# Patient Record
Sex: Male | Born: 1935 | Race: White | Hispanic: No | Smoking: Former smoker
Health system: Southern US, Community
[De-identification: ages and names within clinical notes are randomized; demographics above are authoritative.]

## PROBLEM LIST (undated history)

## (undated) DIAGNOSIS — I10 Essential (primary) hypertension: Secondary | ICD-10-CM

## (undated) DIAGNOSIS — I519 Heart disease, unspecified: Secondary | ICD-10-CM

## (undated) DIAGNOSIS — R413 Other amnesia: Secondary | ICD-10-CM

## (undated) DIAGNOSIS — T7840XA Allergy, unspecified, initial encounter: Secondary | ICD-10-CM

## (undated) DIAGNOSIS — H9190 Unspecified hearing loss, unspecified ear: Secondary | ICD-10-CM

## (undated) DIAGNOSIS — E119 Type 2 diabetes mellitus without complications: Secondary | ICD-10-CM

## (undated) HISTORY — DX: Unspecified hearing loss, unspecified ear: H91.90

## (undated) HISTORY — DX: Allergy, unspecified, initial encounter: T78.40XA

## (undated) HISTORY — DX: Essential (primary) hypertension: I10

## (undated) HISTORY — DX: Type 2 diabetes mellitus without complications: E11.9

## (undated) HISTORY — PX: PACEMAKER PLACEMENT: SHX43

## (undated) HISTORY — DX: Other amnesia: R41.3

## (undated) HISTORY — DX: Heart disease, unspecified: I51.9

---

## 2005-08-09 ENCOUNTER — Ambulatory Visit: Payer: Self-pay | Admitting: Family Medicine

## 2005-08-15 ENCOUNTER — Ambulatory Visit: Payer: Self-pay | Admitting: Family Medicine

## 2005-09-14 ENCOUNTER — Ambulatory Visit: Payer: Self-pay | Admitting: Family Medicine

## 2005-10-15 ENCOUNTER — Ambulatory Visit: Payer: Self-pay | Admitting: Family Medicine

## 2009-09-07 ENCOUNTER — Encounter: Payer: Self-pay | Admitting: Internal Medicine

## 2009-09-14 ENCOUNTER — Encounter: Payer: Self-pay | Admitting: Internal Medicine

## 2009-10-05 ENCOUNTER — Encounter: Payer: Self-pay | Admitting: Internal Medicine

## 2009-10-15 ENCOUNTER — Encounter: Payer: Self-pay | Admitting: Internal Medicine

## 2009-11-15 ENCOUNTER — Encounter: Payer: Self-pay | Admitting: Internal Medicine

## 2009-12-13 ENCOUNTER — Encounter: Payer: Self-pay | Admitting: Internal Medicine

## 2010-06-07 ENCOUNTER — Ambulatory Visit: Payer: Self-pay | Admitting: Family Medicine

## 2011-07-03 ENCOUNTER — Observation Stay: Payer: Self-pay | Admitting: *Deleted

## 2012-05-09 ENCOUNTER — Ambulatory Visit: Payer: Self-pay | Admitting: Emergency Medicine

## 2013-02-08 ENCOUNTER — Observation Stay: Payer: Self-pay | Admitting: Internal Medicine

## 2013-02-08 LAB — URINALYSIS, COMPLETE
Bacteria: NONE SEEN
Bilirubin,UR: NEGATIVE
Ketone: NEGATIVE
Ph: 5 (ref 4.5–8.0)
Protein: NEGATIVE
RBC,UR: 2 /HPF (ref 0–5)
Specific Gravity: 1.017 (ref 1.003–1.030)
Squamous Epithelial: NONE SEEN

## 2013-02-08 LAB — COMPREHENSIVE METABOLIC PANEL
Albumin: 3.5 g/dL (ref 3.4–5.0)
Bilirubin,Total: 1.7 mg/dL — ABNORMAL HIGH (ref 0.2–1.0)
Calcium, Total: 8.8 mg/dL (ref 8.5–10.1)
Creatinine: 0.85 mg/dL (ref 0.60–1.30)
EGFR (African American): >60
EGFR (Non-African Amer.): >60
Glucose: 115 mg/dL — ABNORMAL HIGH (ref 65–99)
Osmolality: 280 (ref 275–301)
Potassium: 3.7 mmol/L (ref 3.5–5.1)

## 2013-02-08 LAB — CK TOTAL AND CKMB (NOT AT ARMC)
CK, Total: 186 U/L (ref 35–232)
CK-MB: 2.2 ng/mL (ref 0.5–3.6)

## 2013-02-08 LAB — CBC
HCT: 43.7 % (ref 40.0–52.0)
HGB: 15.1 g/dL (ref 13.0–18.0)
MCH: 30.8 pg (ref 26.0–34.0)
RBC: 4.92 10*6/uL (ref 4.40–5.90)

## 2013-02-08 LAB — PROTIME-INR
INR: 1.1
Prothrombin Time: 14.4 secs (ref 11.5–14.7)

## 2013-02-09 LAB — BASIC METABOLIC PANEL
Calcium, Total: 8.3 mg/dL — ABNORMAL LOW (ref 8.5–10.1)
Chloride: 107 mmol/L (ref 98–107)
Co2: 26 mmol/L (ref 21–32)
Creatinine: 0.87 mg/dL (ref 0.60–1.30)
EGFR (African American): >60
EGFR (Non-African Amer.): >60
Osmolality: 281 (ref 275–301)
Sodium: 141 mmol/L (ref 136–145)

## 2013-02-09 LAB — CBC WITH DIFFERENTIAL/PLATELET
Basophil %: 0.8 %
Eosinophil %: 5.3 %
HCT: 41.6 % (ref 40.0–52.0)
HGB: 14.8 g/dL (ref 13.0–18.0)
Lymphocyte #: 1.1 10*3/uL (ref 1.0–3.6)
MCHC: 35.4 g/dL (ref 32.0–36.0)
Monocyte #: 0.7 x10 3/mm (ref 0.2–1.0)
Monocyte %: 9.4 %
Neutrophil %: 68.6 %
Platelet: 243 10*3/uL (ref 150–440)
RBC: 4.74 10*6/uL (ref 4.40–5.90)
WBC: 7.1 10*3/uL (ref 3.8–10.6)

## 2013-12-31 ENCOUNTER — Ambulatory Visit (INDEPENDENT_AMBULATORY_CARE_PROVIDER_SITE_OTHER): Payer: 59 | Admitting: Podiatrist

## 2013-12-31 ENCOUNTER — Encounter: Payer: Self-pay | Admitting: Podiatrist

## 2013-12-31 VITALS — BP 126/58 | HR 78 | Resp 16 | Ht 65.0 in | Wt 182.0 lb

## 2013-12-31 DIAGNOSIS — B351 Tinea unguium: Secondary | ICD-10-CM

## 2013-12-31 DIAGNOSIS — M79609 Pain in unspecified limb: Secondary | ICD-10-CM

## 2013-12-31 NOTE — Progress Notes (Signed)
   HPI:  Patient presents today for follow up of foot and nail care. Denies any new complaints today. He has a raised sore on the medial side of the right ankle from scratching at his psoriasis.   Objective:  Patients chart is reviewed.  Vascular status reveals pedal pulses noted at 1 out of 4 dp and pt bilateral .  Neurological sensation is Normal to [facility] bilateral.  Patients nails are thickened, discolored, distrophic, friable and brittle with yellow-brown discoloration. Patient subjectively relates they are painful with shoes and with ambulation of bilateral feet. No pre ulcerative lesions present today  Assessment:  Symptomatic onychomycosis  Plan:  Discussed treatment options and alternatives.  The symptomatic toenails were debrided through manual an mechanical means without complication.  Return appointment recommended at routine intervals of 3 months    Marlowe AschoffKathryn Egerton, DPM

## 2014-02-12 ENCOUNTER — Ambulatory Visit: Payer: Self-pay | Admitting: Internal Medicine

## 2014-02-26 LAB — COMPREHENSIVE METABOLIC PANEL
ALK PHOS: 70 U/L
Albumin: 3.4 g/dL (ref 3.4–5.0)
Anion Gap: 6 — ABNORMAL LOW (ref 7–16)
BUN: 15 mg/dL (ref 7–18)
Bilirubin,Total: 1.4 mg/dL — ABNORMAL HIGH (ref 0.2–1.0)
CHLORIDE: 103 mmol/L (ref 98–107)
CO2: 28 mmol/L (ref 21–32)
CREATININE: 0.75 mg/dL (ref 0.60–1.30)
Calcium, Total: 8.8 mg/dL (ref 8.5–10.1)
EGFR (African American): >60
EGFR (Non-African Amer.): >60
Glucose: 142 mg/dL — ABNORMAL HIGH (ref 65–99)
Osmolality: 277 (ref 275–301)
POTASSIUM: 3.7 mmol/L (ref 3.5–5.1)
SGOT(AST): 13 U/L — ABNORMAL LOW (ref 15–37)
SGPT (ALT): 17 U/L (ref 12–78)
Sodium: 137 mmol/L (ref 136–145)
TOTAL PROTEIN: 7.5 g/dL (ref 6.4–8.2)

## 2014-02-26 LAB — CBC
HCT: 44.4 % (ref 40.0–52.0)
HGB: 15 g/dL (ref 13.0–18.0)
MCH: 30 pg (ref 26.0–34.0)
MCHC: 33.7 g/dL (ref 32.0–36.0)
MCV: 89 fL (ref 80–100)
Platelet: 285 10*3/uL (ref 150–440)
RBC: 4.98 10*6/uL (ref 4.40–5.90)
RDW: 13.8 % (ref 11.5–14.5)
WBC: 11.7 10*3/uL — AB (ref 3.8–10.6)

## 2014-02-26 LAB — URINALYSIS, COMPLETE
Bacteria: NONE SEEN
Bilirubin,UR: NEGATIVE
Glucose,UR: >=500 mg/dL
Ketone: NEGATIVE
Leukocyte Esterase: NEGATIVE
Nitrite: NEGATIVE
Ph: 7
Protein: NEGATIVE
RBC,UR: 2 /HPF
Specific Gravity: 1.014
Squamous Epithelial: NONE SEEN
WBC UR: 2 /HPF

## 2014-02-26 LAB — CK TOTAL AND CKMB (NOT AT ARMC)
CK, Total: 100 U/L
CK-MB: 2.5 ng/mL

## 2014-02-26 LAB — TROPONIN I

## 2014-02-27 ENCOUNTER — Ambulatory Visit: Payer: Self-pay | Admitting: Neurology

## 2014-02-27 LAB — CBC WITH DIFFERENTIAL/PLATELET
BASOS ABS: 0.1 10*3/uL (ref 0.0–0.1)
Basophil %: 0.4 %
Eosinophil #: 0.1 10*3/uL (ref 0.0–0.7)
Eosinophil %: 0.6 %
HCT: 42.2 % (ref 40.0–52.0)
HGB: 14.4 g/dL (ref 13.0–18.0)
LYMPHS ABS: 1 10*3/uL (ref 1.0–3.6)
Lymphocyte %: 8.1 %
MCH: 29.9 pg (ref 26.0–34.0)
MCHC: 34.1 g/dL (ref 32.0–36.0)
MCV: 88 fL (ref 80–100)
Monocyte #: 1 x10 3/mm (ref 0.2–1.0)
Monocyte %: 7.8 %
Neutrophil #: 10.6 10*3/uL — ABNORMAL HIGH (ref 1.4–6.5)
Neutrophil %: 83.1 %
Platelet: 295 10*3/uL (ref 150–440)
RBC: 4.8 10*6/uL (ref 4.40–5.90)
RDW: 13.5 % (ref 11.5–14.5)
WBC: 12.8 10*3/uL — ABNORMAL HIGH (ref 3.8–10.6)

## 2014-02-27 LAB — BASIC METABOLIC PANEL
ANION GAP: 4 — AB (ref 7–16)
BUN: 12 mg/dL (ref 7–18)
CHLORIDE: 107 mmol/L (ref 98–107)
CREATININE: 0.94 mg/dL (ref 0.60–1.30)
Calcium, Total: 8.4 mg/dL — ABNORMAL LOW (ref 8.5–10.1)
Co2: 26 mmol/L (ref 21–32)
EGFR (African American): >60
EGFR (Non-African Amer.): >60
Glucose: 125 mg/dL — ABNORMAL HIGH (ref 65–99)
Osmolality: 275 (ref 275–301)
Potassium: 3.7 mmol/L (ref 3.5–5.1)
Sodium: 137 mmol/L (ref 136–145)

## 2014-02-28 LAB — URINE CULTURE

## 2014-03-01 ENCOUNTER — Encounter: Payer: Self-pay | Admitting: Internal Medicine

## 2014-03-02 ENCOUNTER — Inpatient Hospital Stay: Payer: Self-pay | Admitting: Internal Medicine

## 2014-03-02 LAB — URINALYSIS, COMPLETE
BILIRUBIN, UR: NEGATIVE
Bacteria: NONE SEEN
Blood: NEGATIVE
Glucose,UR: 150 mg/dL (ref 0–75)
Ketone: NEGATIVE
NITRITE: NEGATIVE
Ph: 6 (ref 4.5–8.0)
Protein: NEGATIVE
RBC,UR: 2 /HPF (ref 0–5)
SPECIFIC GRAVITY: 1.016 (ref 1.003–1.030)
SQUAMOUS EPITHELIAL: NONE SEEN

## 2014-03-03 LAB — BASIC METABOLIC PANEL
Anion Gap: 7 (ref 7–16)
BUN: 20 mg/dL — ABNORMAL HIGH (ref 7–18)
CALCIUM: 9 mg/dL (ref 8.5–10.1)
CHLORIDE: 106 mmol/L (ref 98–107)
CO2: 25 mmol/L (ref 21–32)
Creatinine: 0.81 mg/dL (ref 0.60–1.30)
EGFR (Non-African Amer.): >60
Glucose: 110 mg/dL — ABNORMAL HIGH (ref 65–99)
Osmolality: 279 (ref 275–301)
POTASSIUM: 4.1 mmol/L (ref 3.5–5.1)
SODIUM: 138 mmol/L (ref 136–145)

## 2014-03-03 LAB — SEDIMENTATION RATE: ERYTHROCYTE SED RATE: 4 mm/h (ref 0–20)

## 2014-03-03 LAB — CBC WITH DIFFERENTIAL/PLATELET
BASOS PCT: 0.9 %
Basophil #: 0.1 10*3/uL (ref 0.0–0.1)
Eosinophil #: 0.4 10*3/uL (ref 0.0–0.7)
Eosinophil %: 4.1 %
HCT: 44.5 % (ref 40.0–52.0)
HGB: 15.5 g/dL (ref 13.0–18.0)
LYMPHS ABS: 1.3 10*3/uL (ref 1.0–3.6)
LYMPHS PCT: 14.4 %
MCH: 30.8 pg (ref 26.0–34.0)
MCHC: 34.7 g/dL (ref 32.0–36.0)
MCV: 89 fL (ref 80–100)
MONOS PCT: 10.2 %
Monocyte #: 0.9 x10 3/mm (ref 0.2–1.0)
NEUTROS ABS: 6.5 10*3/uL (ref 1.4–6.5)
Neutrophil %: 70.4 %
Platelet: 310 10*3/uL (ref 150–440)
RBC: 5.01 10*6/uL (ref 4.40–5.90)
RDW: 13.9 % (ref 11.5–14.5)
WBC: 9.3 10*3/uL (ref 3.8–10.6)

## 2014-03-03 LAB — HEPATIC FUNCTION PANEL A (ARMC)
ALK PHOS: 66 U/L
Albumin: 3.2 g/dL — ABNORMAL LOW (ref 3.4–5.0)
Bilirubin, Direct: 0.2 mg/dL (ref 0.00–0.20)
Bilirubin,Total: 0.8 mg/dL (ref 0.2–1.0)
SGOT(AST): 27 U/L (ref 15–37)
SGPT (ALT): 24 U/L (ref 12–78)
TOTAL PROTEIN: 6.8 g/dL (ref 6.4–8.2)

## 2014-03-03 LAB — AMMONIA: AMMONIA, PLASMA: 17 umol/L (ref 11–32)

## 2014-03-05 LAB — PLATELET COUNT: Platelet: 335 10*3/uL (ref 150–440)

## 2014-03-15 ENCOUNTER — Encounter: Payer: Self-pay | Admitting: Internal Medicine

## 2014-03-15 ENCOUNTER — Ambulatory Visit: Payer: Self-pay | Admitting: Internal Medicine

## 2014-03-19 LAB — COMPREHENSIVE METABOLIC PANEL
ALT: 43 U/L (ref 12–78)
AST: 24 U/L (ref 15–37)
Albumin: 2.9 g/dL — ABNORMAL LOW (ref 3.4–5.0)
Alkaline Phosphatase: 69 U/L
Anion Gap: 7 (ref 7–16)
BILIRUBIN TOTAL: 0.8 mg/dL (ref 0.2–1.0)
BUN: 69 mg/dL — AB (ref 7–18)
CALCIUM: 8.7 mg/dL (ref 8.5–10.1)
CHLORIDE: 113 mmol/L — AB (ref 98–107)
Co2: 17 mmol/L — ABNORMAL LOW (ref 21–32)
Creatinine: 1.56 mg/dL — ABNORMAL HIGH (ref 0.60–1.30)
EGFR (African American): 47 — ABNORMAL LOW
EGFR (Non-African Amer.): 40 — ABNORMAL LOW
Glucose: 224 mg/dL — ABNORMAL HIGH (ref 65–99)
Osmolality: 301 (ref 275–301)
Potassium: 4.2 mmol/L (ref 3.5–5.1)
Sodium: 137 mmol/L (ref 136–145)
Total Protein: 7.9 g/dL (ref 6.4–8.2)

## 2014-03-19 LAB — LIPASE, BLOOD: Lipase: 76 U/L (ref 73–393)

## 2014-03-19 LAB — TROPONIN I: Troponin-I: <0.02 ng/mL

## 2014-03-19 LAB — CBC
HCT: 47.6 % (ref 40.0–52.0)
HGB: 15.2 g/dL (ref 13.0–18.0)
MCH: 28.8 pg (ref 26.0–34.0)
MCHC: 31.9 g/dL — ABNORMAL LOW (ref 32.0–36.0)
MCV: 90 fL (ref 80–100)
Platelet: 406 10*3/uL (ref 150–440)
RBC: 5.27 10*6/uL (ref 4.40–5.90)
RDW: 13.6 % (ref 11.5–14.5)
WBC: 31.4 10*3/uL — AB (ref 3.8–10.6)

## 2014-03-19 LAB — MAGNESIUM: Magnesium: 2.4 mg/dL

## 2014-03-20 ENCOUNTER — Inpatient Hospital Stay: Payer: Self-pay | Admitting: Internal Medicine

## 2014-03-20 LAB — URINALYSIS, COMPLETE
BILIRUBIN, UR: NEGATIVE
Bacteria: NONE SEEN
Blood: NEGATIVE
Glucose,UR: 50 mg/dL (ref 0–75)
Hyaline Cast: 8
Ketone: NEGATIVE
NITRITE: NEGATIVE
Ph: 5 (ref 4.5–8.0)
Protein: NEGATIVE
RBC,UR: 7 /HPF (ref 0–5)
SQUAMOUS EPITHELIAL: NONE SEEN
Specific Gravity: 1.024 (ref 1.003–1.030)

## 2014-03-20 LAB — PROTIME-INR
INR: 1.3
Prothrombin Time: 16 secs — ABNORMAL HIGH (ref 11.5–14.7)

## 2014-03-20 LAB — PHOSPHORUS: Phosphorus: 3.5 mg/dL (ref 2.5–4.9)

## 2014-03-20 LAB — DIFFERENTIAL
Basophil #: 0.1 10*3/uL (ref 0.0–0.1)
Basophil %: 0.3 %
EOS ABS: 0 10*3/uL (ref 0.0–0.7)
Eosinophil %: 0.1 %
LYMPHS ABS: 0.7 10*3/uL — AB (ref 1.0–3.6)
Lymphocyte %: 2.2 %
Monocyte #: 1.3 x10 3/mm — ABNORMAL HIGH (ref 0.2–1.0)
Monocyte %: 4.2 %
NEUTROS ABS: 29.2 10*3/uL — AB (ref 1.4–6.5)
Neutrophil %: 93.2 %

## 2014-03-21 LAB — URINE CULTURE

## 2014-03-21 LAB — BASIC METABOLIC PANEL
ANION GAP: 4 — AB (ref 7–16)
BUN: 34 mg/dL — ABNORMAL HIGH (ref 7–18)
Calcium, Total: 7.8 mg/dL — ABNORMAL LOW (ref 8.5–10.1)
Chloride: 121 mmol/L — ABNORMAL HIGH (ref 98–107)
Co2: 20 mmol/L — ABNORMAL LOW (ref 21–32)
Creatinine: 1.05 mg/dL (ref 0.60–1.30)
EGFR (African American): >60
EGFR (Non-African Amer.): >60
Glucose: 119 mg/dL — ABNORMAL HIGH (ref 65–99)
Osmolality: 297 (ref 275–301)
Potassium: 3.5 mmol/L (ref 3.5–5.1)
SODIUM: 145 mmol/L (ref 136–145)

## 2014-03-22 LAB — CBC WITH DIFFERENTIAL/PLATELET
Basophil #: 0.1 10*3/uL (ref 0.0–0.1)
Basophil %: 0.5 %
Eosinophil #: 0.5 10*3/uL (ref 0.0–0.7)
Eosinophil %: 3.6 %
HCT: 36.6 % — ABNORMAL LOW (ref 40.0–52.0)
HGB: 12.1 g/dL — AB (ref 13.0–18.0)
LYMPHS PCT: 8.1 %
Lymphocyte #: 1.1 10*3/uL (ref 1.0–3.6)
MCH: 29.6 pg (ref 26.0–34.0)
MCHC: 33.1 g/dL (ref 32.0–36.0)
MCV: 89 fL (ref 80–100)
MONO ABS: 1.3 x10 3/mm — AB (ref 0.2–1.0)
Monocyte %: 9.4 %
Neutrophil #: 10.6 10*3/uL — ABNORMAL HIGH (ref 1.4–6.5)
Neutrophil %: 78.4 %
PLATELETS: 298 10*3/uL (ref 150–440)
RBC: 4.09 10*6/uL — AB (ref 4.40–5.90)
RDW: 13.4 % (ref 11.5–14.5)
WBC: 13.5 10*3/uL — ABNORMAL HIGH (ref 3.8–10.6)

## 2014-03-23 ENCOUNTER — Encounter: Payer: Self-pay | Admitting: Internal Medicine

## 2014-03-23 LAB — CBC WITH DIFFERENTIAL/PLATELET
Basophil #: 0.1 10*3/uL (ref 0.0–0.1)
Basophil %: 0.5 %
EOS PCT: 4.2 %
Eosinophil #: 0.6 10*3/uL (ref 0.0–0.7)
HCT: 37.6 % — AB (ref 40.0–52.0)
HGB: 12.8 g/dL — ABNORMAL LOW (ref 13.0–18.0)
LYMPHS ABS: 1.1 10*3/uL (ref 1.0–3.6)
LYMPHS PCT: 8.2 %
MCH: 29.9 pg (ref 26.0–34.0)
MCHC: 34.1 g/dL (ref 32.0–36.0)
MCV: 88 fL (ref 80–100)
Monocyte #: 1.1 x10 3/mm — ABNORMAL HIGH (ref 0.2–1.0)
Monocyte %: 8.1 %
NEUTROS ABS: 10.8 10*3/uL — AB (ref 1.4–6.5)
NEUTROS PCT: 79 %
PLATELETS: 296 10*3/uL (ref 150–440)
RBC: 4.29 10*6/uL — ABNORMAL LOW (ref 4.40–5.90)
RDW: 12.9 % (ref 11.5–14.5)
WBC: 13.7 10*3/uL — ABNORMAL HIGH (ref 3.8–10.6)

## 2014-03-23 LAB — BASIC METABOLIC PANEL
Anion Gap: 8 (ref 7–16)
BUN: 13 mg/dL (ref 7–18)
CO2: 19 mmol/L — AB (ref 21–32)
Calcium, Total: 8 mg/dL — ABNORMAL LOW (ref 8.5–10.1)
Chloride: 116 mmol/L — ABNORMAL HIGH (ref 98–107)
Creatinine: 0.81 mg/dL (ref 0.60–1.30)
EGFR (African American): >60
EGFR (Non-African Amer.): >60
Glucose: 97 mg/dL (ref 65–99)
Osmolality: 285 (ref 275–301)
Potassium: 3.4 mmol/L — ABNORMAL LOW (ref 3.5–5.1)
Sodium: 143 mmol/L (ref 136–145)

## 2014-03-23 LAB — MAGNESIUM: Magnesium: 2 mg/dL

## 2014-03-23 LAB — CLOSTRIDIUM DIFFICILE(ARMC)

## 2014-03-24 LAB — WBCS, STOOL

## 2014-03-25 LAB — CULTURE, BLOOD (SINGLE)

## 2014-03-25 LAB — STOOL CULTURE

## 2014-03-30 LAB — BASIC METABOLIC PANEL
ANION GAP: 9 (ref 7–16)
BUN: 14 mg/dL (ref 7–18)
CHLORIDE: 104 mmol/L (ref 98–107)
CO2: 23 mmol/L (ref 21–32)
CREATININE: 0.71 mg/dL (ref 0.60–1.30)
Calcium, Total: 8.7 mg/dL (ref 8.5–10.1)
EGFR (African American): >60
Glucose: 119 mg/dL — ABNORMAL HIGH (ref 65–99)
Osmolality: 274 (ref 275–301)
POTASSIUM: 3.9 mmol/L (ref 3.5–5.1)
Sodium: 136 mmol/L (ref 136–145)

## 2014-03-30 LAB — CBC WITH DIFFERENTIAL/PLATELET
BASOS ABS: 0.1 10*3/uL (ref 0.0–0.1)
BASOS PCT: 0.7 %
EOS ABS: 0.5 10*3/uL (ref 0.0–0.7)
EOS PCT: 4.4 %
HCT: 41 % (ref 40.0–52.0)
HGB: 13.9 g/dL (ref 13.0–18.0)
LYMPHS PCT: 12.7 %
Lymphocyte #: 1.5 10*3/uL (ref 1.0–3.6)
MCH: 29.8 pg (ref 26.0–34.0)
MCHC: 33.9 g/dL (ref 32.0–36.0)
MCV: 88 fL (ref 80–100)
MONOS PCT: 10.4 %
Monocyte #: 1.2 x10 3/mm — ABNORMAL HIGH (ref 0.2–1.0)
NEUTROS ABS: 8.6 10*3/uL — AB (ref 1.4–6.5)
Neutrophil %: 71.8 %
Platelet: 348 10*3/uL (ref 150–440)
RBC: 4.66 10*6/uL (ref 4.40–5.90)
RDW: 13.5 % (ref 11.5–14.5)
WBC: 12 10*3/uL — AB (ref 3.8–10.6)

## 2014-04-01 ENCOUNTER — Ambulatory Visit: Payer: 59 | Admitting: Podiatrist

## 2014-04-14 ENCOUNTER — Encounter: Payer: Self-pay | Admitting: Internal Medicine

## 2014-05-15 ENCOUNTER — Encounter: Payer: Self-pay | Admitting: Internal Medicine

## 2014-06-15 ENCOUNTER — Encounter: Payer: Self-pay | Admitting: Internal Medicine

## 2014-07-15 ENCOUNTER — Encounter: Payer: Self-pay | Admitting: Internal Medicine

## 2014-08-15 ENCOUNTER — Encounter: Payer: Self-pay | Admitting: Internal Medicine

## 2014-09-14 ENCOUNTER — Encounter: Payer: Self-pay | Admitting: Internal Medicine

## 2014-09-28 LAB — BASIC METABOLIC PANEL
ANION GAP: 7 (ref 7–16)
BUN: 12 mg/dL (ref 7–18)
CREATININE: 0.86 mg/dL (ref 0.60–1.30)
Calcium, Total: 8.6 mg/dL (ref 8.5–10.1)
Chloride: 105 mmol/L (ref 98–107)
Co2: 26 mmol/L (ref 21–32)
EGFR (African American): >60
EGFR (Non-African Amer.): >60
Glucose: 93 mg/dL (ref 65–99)
OSMOLALITY: 275 (ref 275–301)
Potassium: 4 mmol/L (ref 3.5–5.1)
Sodium: 138 mmol/L (ref 136–145)

## 2014-10-15 ENCOUNTER — Encounter: Payer: Self-pay | Admitting: Internal Medicine

## 2014-11-15 ENCOUNTER — Encounter: Payer: Self-pay | Admitting: Internal Medicine

## 2014-12-14 ENCOUNTER — Encounter
Admit: 2014-12-14 | Disposition: A | Discharge status: Home / Self Care | Payer: Self-pay | Attending: Internal Medicine | Admitting: Internal Medicine

## 2015-01-14 ENCOUNTER — Encounter
Admit: 2015-01-14 | Disposition: A | Discharge status: Home / Self Care | Payer: Self-pay | Attending: Internal Medicine | Admitting: Internal Medicine

## 2015-01-14 DEATH — deceased

## 2015-02-04 NOTE — H&P (Signed)
PATIENT NAME:  Sean Kaufman, SULTON MR#:  [id number] DATE OF BIRTH:  22-Sep-1927  DATE OF ADMISSION:  02/08/2013  PRIMARY CARE PHYSICIAN: Dr. Rolin Barry.   CHIEF COMPLAINT: Altered mental status.   HISTORY OF PRESENT ILLNESS: This is an 81 year old male who presents to the hospital due to confusion and altered mental status that started late yesterday evening. The patient himself is currently demented and is delirious, therefore most of the history was obtained from the wife and the daughter at bedside.   As per the family, the patient has underlying dementia but is able to do most of his ADLs at home. Since yesterday evening the patient has been more lethargic and confused than usual. As per the wife, the patient fell this morning. He also has been picking at things and hallucinating over the past day or so. As per the family, the patient about a week or so ago was switched from trazodone to Tylenol PM for sleep. The Tylenol PM does not seem to have helped him as he has not slept well in the past few days. His last good night of sleep was about 2 days ago. Despite having a good night of sleep 2 days ago the patient's delirium has not improved since he fell this morning, and he was still weak and confused. He was brought to the ER for further evaluation.   REVIEW OF SYSTEMS:  CONSTITUTIONAL: No documented fever. No weight gain, no weight loss.  EYES: No blurred or double vision.  ENT: No tinnitus. No postnasal drip. No redness of the oropharynx.  RESPIRATORY: No cough, no wheeze, no hemoptysis, no dyspnea.  CARDIOVASCULAR: No chest pain, no orthopnea, no palpitations, no syncope.  GASTROINTESTINAL: No nausea, no vomiting, no diarrhea. No abdominal pain, no melena or hematochezia.  GENITOURINARY: No dysuria or hematuria.  ENDOCRINE: No polyuria or nocturia, heat or cold intolerance.  HEMATOLOGIC: No anemia. No bruising. No bleeding.  INTEGUMENTARY: No rashes. No lesions.  MUSCULOSKELETAL: No  arthritis. No swelling. No gout.  NEUROLOGIC: No numbness. No tingling. No ataxia. No seizure-type activity.  PSYCHIATRIC: No anxiety. No insomnia. No ADD. Positive dementia.  ALLERGIES: PREDNISONE.    SOCIAL HISTORY: No smoking. No alcohol abuse. No illicit drug abuse. Lives at home with his wife.   FAMILY HISTORY: The patient's mother died from complications of pneumonia. Father died due to alcohol abuse.   CURRENT MEDICATIONS: Are as follows: Amlodipine 5 mg daily, aspirin 81 mg daily, Coreg 3.125 mg b.i.d., Colace 100 mg 3 times weekly as needed, Aricept 5 mg at bedtime, lisinopril 40 mg daily, metformin 850 mg daily, trazodone 50 mg, one-half tablet  to 1 tablet at bedtime, vitamin D3 1000 international units daily, vitamin E 400 international units daily.   PHYSICAL EXAMINATION:  On admission was as follows:   Patient's vital signs were noted to be: Temperature is 98.5, pulse 62, respirations 18, blood pressure 125/58, sats 97% on room air.   GENERAL: The patient is a pleasant-appearing male, but confused and hallucinating.  HEENT: He is atraumatic, normocephalic. He would not open his eyes. Difficult to examine. He would also not open his mouth, therefore I did not examine his oropharynx.  NECK: Supple. There is no jugular venous distention. No bruits, no lymphadenopathy or thyromegaly.  HEART EXAM: Regular rate and rhythm. No murmurs. No rubs. No clicks.  LUNGS: Clear to auscultation bilaterally. No rales, no rhonchi. No wheezes. No dullness to percussion.  ABDOMEN: Soft, flat, nontender, nondistended. Has good bowel sounds.  No hepatosplenomegaly appreciated.  EXTREMITIES: No evidence of any cyanosis, clubbing, or peripheral edema. Has +2 pedal and radial pulses bilaterally.  NEUROLOGICAL: The patient is alert, awake, and oriented x 1. Moves all extremities spontaneously. Difficult to do a full neurological exam given his acute delirium and underlying dementia.  SKIN: Moist, warm,  with no rashes.  LYMPHATIC: There is no cervical or axillary lymphadenopathy.   LABORATORY EXAM: Showed a serum glucose of 115, BUN 16, creatinine 0.8, sodium 139, potassium 3.7, chloride 107, bicarbonate 24.   Patient's LFTs are within normal limits. Troponin less than 0.02.   White cell count 10.4, hemoglobin 15.1, hematocrit 43.7, platelet count of 267. INR is 1.1. Urinalysis is within normal limits.   The patient did have a CT of the head done without contrast which showed no evidence of any acute intracranial process.   The patient also had a CT of the cervical spine without contrast which showed no evidence of any acute osseous injury.   The patient also had a chest x-ray done which showed no acute cardiopulmonary disease of the chest.   X-ray of the left shoulder showing no acute osseous abnormality.   An x-ray of the left elbow also showing no evidence of any acute osseous abnormality.   An x-ray of the right knee showing no acute osseous injury either.   ASSESSMENT AND PLAN: This is an 81 year old male with a history of dementia, history of sick sinus syndrome status post pacemaker, hypertension, diabetes, who presents to the hospital with altered mental status and confusion.   1.  Altered mental status/confusion: Exact etiology of this is unclear, but likely related to acute delirium in the setting of underlying dementia. The source of acute delirium is currently unclear, but I suspect is secondary to the fact of him being devoid of sleep for the past few days. The patient apparently had been switched over from trazodone to Tylenol PM, which does not seem to have helped him as he has not slept well in days. I do not appreciate any evidence of any acute metabolic source or infectious source as the patient's chest x-ray is negative, urinalysis is negative. There was no evidence of any acute electrolyte abnormalities either. The patient's CT head is negative. CT cervical spine is also  currently negative. For now observe the patient overnight, follow q.4 hour neuro checks, avoid deliriogenic medications like benzodiazepines, hold his Tylenol PM, resume his trazodone for sleep, give him p.r.n. Haldol for agitation for now.  2.  Hypertension: Presently hemodynamically stable. I will continue his Coreg, Norvasc and lisinopril.  3.  History of sick sinus syndrome, status post pacemaker. There is no acute issue related to this at this time.  4.  Diabetes: Since the patient is going to be n.p.o., I will place him on sliding-scale insulin, hold his metformin for now.  5.  Dementia: Continue with Aricept and continue p.r.n. Haldol for agitation.   THE PATIENT IS A FULL CODE.   Time spent with the admission is 50 minutes.      ____________________________ Rolly PancakeVivek J. Cherlynn KaiserSainani, MD vjs:dm D: 02/08/2013 15:04:47 ET T: 02/08/2013 15:23:23 ET JOB#: [id number]  cc: Rolly PancakeVivek J. Cherlynn KaiserSainani, MD, <Dictator> Houston SirenVIVEK J Marylynn Rigdon MD ELECTRONICALLY SIGNED 02/10/2013 19:37

## 2015-02-04 NOTE — Discharge Summary (Signed)
PATIENT NAME:  Sean Kaufman, Sean Kaufman MR#:  [id number] DATE OF BIRTH:  Jul 18, 1927  DATE OF ADMISSION:  02/08/2013 DATE OF DISCHARGE:  02/10/2013  ADMITTING DIAGNOSIS: Altered mental status.   DISCHARGE DIAGNOSES: 1.  Acute delirium, possibly due to medications.  2.  Chronic Alzheimer's dementia.  3.  Hypertension.  4.  History of sick sinus syndrome status post pacemaker.  5.  Diabetes.  6.  Chronic psoriasis.  7.  History of coronary artery disease.  8.  Status post cataract removal.   PERTINENT LABORATORY, DIAGNOSTIC AND RADIOLOGICAL DATA:  Admitting BMP: Glucose was 115, BUN 16, creatinine 0.85, sodium 139, potassium was 3.7, chloride 107, CO2 was 24, calcium 8.8. LFTs were normal except a bili total of 1.7. WBC 10.4, hemoglobin 15.1, platelet count 267. INR 1.1. Blood cultures x 2 no growth. UA:  Nitrites negative, leukocytes negative.   EKG showed an electronic ventricular pacemaker. CT scan of the head showed no acute abnormality. CT C-spine showed no acute abnormality. X-ray of his right ankle showed no fracture, no dislocation or osteoarthritic changes.   HOSPITAL COURSE: Please refer to H and P done by the admitting physician. The patient is an 81 year old white male with a history of dementia who stays at home, who was brought to the hospital for confusion and altered mental status. The patient apparently was recently started from trazodone to Tylenol PM for sleep. The patient did not sleep well and came in with confusion and not acting as he normally did. The patient was seen in the ED and was evaluated, had a urinalysis that was negative, CT scan of the head which was negative. The patient, besides the medication, did not have any evidence of cause for his mental status changes. The patient was restarted on his trazodone. He did well the first night.  He is very weak and deconditioned, and at this time daughter requests something else to help him sleep when he is discharged from here.  At  this time, Remeron is recommended which they will try tonight. At this time, he is stable for discharge.   DISCHARGE MEDICATIONS: Aspirin 81, 1 tab p.o. daily, lisinopril 40 daily, metformin 850,  1 tab p.o. daily, vitamin D3 1000 daily, vitamin E 400 daily, amlodipine 5 daily, carvedilol 3.125, 1 tab p.o. b.i.d. donepezil 5 mg daily, docusate 100, 1 tab 3 times a week on Tuesday, Thursday, Saturday, Tylenol 650 q. 4 p.r.n., mirtazapine 15 at bedtime p.r.n. for sleep.   DIET: Low sodium, low fat, low cholesterol, carbohydrate-controlled diet.   ACTIVITY: As tolerated with PT and OT. The patient is referred to rehab.   FOLLOWUP: With primary MD in 1 to 2 weeks.   TIME SPENT: 32 minutes spent.   ____________________________ Lacie ScottsShreyang H. Allena KatzPatel, MD shp:cb D: 02/10/2013 14:00:16 ET T: 02/10/2013 14:07:28 ET JOB#: [id number]  cc: Lyna Laningham H. Allena KatzPatel, MD, <Dictator> Charise CarwinSHREYANG H Caitlynn Ju MD ELECTRONICALLY SIGNED 02/13/2013 20:07

## 2015-02-05 NOTE — H&P (Signed)
PATIENT NAME:  Sean Kaufman, Sean Kaufman MR#:  [id number] DATE OF BIRTH:  May 21, 1927  DATE OF ADMISSION:  03/20/2014  ADDENDUM.  This is a continuation.   LABORATORY DATA:  Sodium 137, potassium 4.2, chloride 113, bicarb 17, anion gap of 7, BUN 69, creatinine 1.56, glucose 224.  LFTs:  Albumin of 2.9; otherwise within normal limits.  WBC 31.4, hemoglobin 15.2, platelets of 406.  Urinalysis, WBC 17, RBC 7, leukocyte esterase 2+, nitrates negative, epithelial cells none.  Lactic acid of 1.  CT abdomen and pelvis performed revealing diffuse ileus or gastroenteritis without definitive findings for obstruction.  Chest x-ray performed revealing subsegmental atelectasis.   ASSESSMENT AND PLAN:  An 81 year old Caucasian gentleman with past medical history of diabetes, hypertension, coronary artery disease, presenting with nausea, vomiting, diarrhea.  1.  Acute kidney injury, likely prerenal given lab data as well as history, provide intravenous fluid hydration with normal saline.  Follow urine output and renal function.  2.  Sepsis:  Meeting septic criteria by respiratory rate and leukocytosis.  Panculture including blood, urine, we will send stool for Clostridium difficile.  Broad antibiotic coverage, so far received vancomycin and Zosyn, continue those and follow culture data.  If Clostridium difficile positive, will require by mouth vancomycin given acute kidney injury and leukocytosis greater than 15.  3.  Diabetes.  Continue all the by mouth agents.  Add insulin sliding scale with q. 6 hour Accu-Cheks.  Goal blood glucose 120 to 180.  4.  Coronary artery disease.  Continue with aspirin, statin and beta-blockade.  5.  Venous thromboembolism prophylaxis with heparin subQ.  6.  CODE STATUS:  THE PATIENT IS DO NOT RESUSCITATE.   TIME SPENT:  45 minutes.     ____________________________ Cletis Athensavid K. Chrisha Vogel, MD dkh:ea D: 03/20/2014 02:42:03 ET T: 03/20/2014 03:40:02 ET JOB#: [id number]  cc: Cletis Athensavid K. Jernard Reiber, MD,  <Dictator> Yassir Enis Synetta ShadowK Klayten Jolliff MD ELECTRONICALLY SIGNED 03/20/2014 20:47

## 2015-02-05 NOTE — Discharge Summary (Signed)
PATIENT NAME:  Sean, Kaufman MR#:  [id number] DATE OF BIRTH:  05-04-1927  DATE OF ADMISSION:  03/02/2014 DATE OF DISCHARGE:  03/06/2014  DISCHARGE DIAGNOSES: 1.  Delirium, metabolic encephalopathy, likely due to medication.  2.  Hypertension.  3.  Diabetes.  4.  Ataxia and weakness causing fall.   CONDITION ON DISCHARGE: Stable.   CODE STATUS: No code, DO NOT RESUSCITATE.   DISCHARGE MEDICATIONS: 1.  Aspirin 81 mg oral once a day.  2.  Lisinopril 40 mg oral once a day.  3.  Vitamin D3 1000 international units oral capsule once a day.  4.  Vitamin E 400 international units oral capsule once a day.  5.  Singulair 10 mg oral tablet once a day.  6.  Tamsulosin 0.4 mg oral capsule once a day.  7.  Carvedilol 6.25 mg oral tablet 2 times a day.  8.  Namenda 5 mg oral tablet 2 times a day.  9.  Megestrol 40 mg/mL oral suspension 10 mL once a day for 7 days.  10.  Clonazepam 0.25 mg oral once a day at bedtime as needed for sleep.  11.  Ranitidine 150 mg oral tablet every 12 hours.  12.  Lactulose 30 mL oral 2 times a day for 5 days.  13.  Melatonin 3 mg oral tablet once a day at bedtime.   DIET ON DISCHARGE: Low sodium, mechanical soft diet.   ACTIVITY: As tolerated.   TIMEFRAME TO FOLLOWUP: Within 1 to 2 weeks with primary care physician. Advised no sedative medication at nursing home.   HISTORY OF PRESENT ILLNESS: An 81 year old gentleman with history of confusion, altered mental status in the past. Also had psoriasis, dementia, diabetes, permanent pacemaker, hypertension. Was really very active for his age till a few weeks ago. Has some right leg pain, which has been progressively coming up until the point he was not able to ambulate. He went to see primary care physician and because of leg dragging after several where he felt like the main reason for him having the pain was bursitis and prescribed some lidocaine patch. The patient continued to have pain and finally he was not able to  walk, needed significant assistance.  He was also dragging himself to move. On the morning of presentation, the patient thought he was strong enough and wanted to walk to the bathroom but then suddenly daughter heard some noise from the bathroom and went to see, found him lying on the floor and was not able to get up and so admitted to medical service for further management of these issues.   HOSPITAL COURSE AND STAY:  1.  Acute delirium. The patient had some baseline dementia but was very active and functional at home. Here in hospital, he was somewhat lethargic and confused. EEG done which showed generalized slowing, metabolic encephalopathy was suspected. Initially, he received some sedative medications and some Haldol but we held it later on, checked labs which were normal including ammonia as well.  Neuro consult was called in and they suggested it is dementia worsening. CT of the head did not show any acute changes and so finally, after holding all the sedative medications, the patient had improvement in the mental condition. We continued clonazepam and melatonin. The patient had improvement in the condition so finally we were able to discharge him at rehab.  2.  Fall, most likely we suspected it due to ataxia or unsteady gait or it can be also secondary to  some pain.  He was sent to rehab.  3.  Leg pain. We were not able to get MRI because of his pacemaker and CAT scan because the patient too unsteady and he was constantly moving with delirium so, after discussing his daughter, started on muscle relaxant and pain medication. The patient was doing fine with that.  4.  Hypertension. Continue carvedilol and lisinopril. Blood pressure remained stable.  5.  Diabetes, maintained on insulin sliding coverage and remained stable.   IMPORTANT LABORATORY, DIAGNOSTIC AND RADIOLOGICAL DATA IN THE HOSPITAL:   1.  BUN was 15, creatinine 0.75, sodium was 137, potassium 3.7, chloride is 103 and CO2 28.  2.  WBC  11.7, hemoglobin 15, platelet count is 285, MCV is 89.  3.  Urinalysis is grossly negative. Urine culture remained negative.  4.  CT of the head without contrast: Motion degraded exam, no visible skull fracture or acute intracranial process.  5.  WBC count on admission 12.8, hemoglobin is 14.4 and platelet count is 88.  6.  X-ray lumbar spine no acute injuries, degenerative changes.  7.  Ammonia level was 17.    TOTAL TIME SPENT ON THIS DISCHARGE: Is 40 minutes.   ____________________________ Sean PigeonVaibhavkumar G. Elisabeth PigeonVachhani, MD vgv:cs D: 03/08/2014 16:18:55 ET T: 03/08/2014 19:16:44 ET JOB#: [id number]  cc: Sean PigeonVaibhavkumar G. Elisabeth PigeonVachhani, MD, <Dictator> Altamese DillingVAIBHAVKUMAR [name] MD ELECTRONICALLY SIGNED 03/09/2014 16:34

## 2015-02-05 NOTE — Discharge Summary (Signed)
PATIENT NAME:  Nelwyn SalisburyHUNT, Victormanuel V MR#:  [id number] DATE OF BIRTH:  06/11/1927  DATE OF ADMISSION:  03/20/2014 DATE OF DISCHARGE:  03/23/2014   PRIMARY CARE PHYSICIAN: Nonlocal.   DISCHARGE DIAGNOSES:  1. Urinary tract infection.  2. Acute gastroenteritis.  3. Acute renal failure with dehydration.  4. Diabetes.  5. Hypertension.  6. Coronary artery disease.   CONDITION: Stable.   CODE STATUS: DNR.   HOME MEDICATIONS: Please refer to the medication reconciliation list.   DIET: Low-sodium, low-fat, low-cholesterol ADA diet.   ACTIVITY: As tolerated.   FOLLOWUP CARE: Follow up with PCP within 1 to 2 weeks.   REASON FOR ADMISSION: Nausea, vomiting, diarrhea.   HOSPITAL COURSE: The patient is an 81 year old Caucasian male with a history of dementia, hypertension, CAD, diabetes, who was sent from the nursing home due to nausea, vomiting and diarrhea multiple times. The patient had leukocytosis at 31.4 in the ED, with mild urinary tract infection and acute renal injury. For detailed history and physical examination, please refer to the admission note dictated by Dr. Clint GuyHower.   Laboratory data on admission date showed sodium 137, potassium 4.2, chloride 113, bicarbonate 17, BUN 69, creatinine 1.56. WBC 31.4, hemoglobin 15.2. Urinalysis showed WBC 17. Chest x-ray did not show any infiltrates.   1. Acute renal failure. After admission, the patient has been treated with IV fluid support. Renal function has become normal.  2. Acute gastroenteritis. After admission, the patient's stool culture and C. difficile were sent, which are negative. The patient has been treated with Zosyn. The patient's nausea and vomiting have resolved.  3. Sepsis with Urinary tract infection. As mentioned above, the patient has been treated with Zosyn,  improved. 4. Sepsis with urinary tract infection and gastroenteritis, resolved after antibiotics and IV fluid support.  5. Diabetes, has been controlled with sliding scale.   6. The patient symptoms have much improved. The patient is demented and noncommunicative. It looks like he is back to his baseline. His vital signs are stable. He is clinically stable and will be discharged to nursing home today. I discussed the patient's discharge plan with nurse, case manager and social worker.   TIME SPENT: 37 minutes.   ____________________________ Shaune PollackQing Calli Bashor, MD qc:lb D: 03/23/2014 10:48:00 ET T: 03/23/2014 11:10:51 ET JOB#: [id number]  cc: Shaune PollackQing Vondra Aldredge, MD, <Dictator> Shaune PollackQING Marcelle Hepner MD ELECTRONICALLY SIGNED 03/24/2014 15:00

## 2015-02-05 NOTE — Consult Note (Signed)
PATIENT NAME:  Sean Kaufman, SWOBODA MR#:  [id number] DATE OF BIRTH:  Jan 22, 1927  DATE OF CONSULTATION:  02/27/2014  REFERRING PHYSICIAN:  Internal medicine hospitalist CONSULTING PHYSICIAN:  Weston Settle, MD  REASON FOR CONSULTATION: Mental status changes.   HISTORY OF PRESENT ILLNESS: The patient is an 81 year old white male who was admitted last night. History is obtained from the daughter at the bedside. The patient has a history of Alzheimer's dementia and had been receiving Aricept treatment, and recently Namenda was added by his neurologist 3 weeks ago. The patient was undergoing an exercise program and earlier this week started complaining of right leg pain, mainly in the right hip, thigh, and knee area. This continued for a few days and then he had a fall as he tried to stand up, causing him to fall, and EMS brought him over to the hospital. Upon arrival to the hospital, the patient's CBC is normal except for a white blood cell count of 12.8. Complete metabolic panel is normal. Cardiac enzymes are negative. Urinalysis is normal. Urine cultures are negative. X-ray of the lumbosacral spine shows no fractures. There are degenerative changes. CT of the brain was performed, which shows no acute hemorrhages. The ventricles are normal and there are no abnormal hypodensities present. Chest x-ray is negative. The patient started getting a little agitated as the nurses started to do their work in the Emergency Room, so he was given 2 to 3 mg of intravenous Ativan in the ER and then received 25 mg of Benadryl as well. Since then, he has been more obtunded and less awake, alert, and less interactive according to the daughter. His baseline is awake, alert, and conversant, with of course intermittent confusion and short-term memory disturbances.   PAST MEDICAL HISTORY: 1.  Hypertension.  2.  Alzheimer's dementia.   PAST SURGICAL HISTORY: Negative.   CURRENT MEDICATIONS IN THE HOSPITAL:  1.  Aspirin 81 mg  daily.  2.  Coreg 3.125 mg b.i.d.  3.  Lisinopril 40 mg daily.  4.  Namenda 5 mg b.i.d.  5.  Aricept 5 mg daily.   ALLERGIES: INCLUDE PREDNISONE.   SOCIAL HISTORY: No smoking, alcohol, or illicit drugs.   FAMILY HISTORY: Noncontributory.   REVIEW OF SYSTEMS: Unobtainable because of the patient's mental status.   PHYSICAL EXAMINATION: VITAL SIGNS: Blood pressure is 160/80, pulse of 62, temperature 98.4.  HEART: Regular rate and rhythm, S1, S2. No murmurs. He does have a pacemaker.  LUNGS: Clear to auscultation.  NECK: There are no carotid bruits.  NEUROLOGIC: He is mostly with eyes closed, but does occasionally open his eyes to pain and stimulation. He is apparently talking rather incoherently and disjointedly and without any correlation to the conversation. His speech is mildly slurred. He is not oriented. Face appears to be symmetrical. He does withdraw to pain in all 4 extremities. He does appear to have some discomfort to pain with right leg movement. No Babinski sign. No Hoffmann sign. He does not cooperative with official motor or sensory testing. Coordination testing could not be performed as well. Gait testing was deferred due to safety reasons. He does occasionally hallucinate it appears and is talking to people who are not there and reaching for things that are not there.   IMPRESSION AND PLAN: This patient is experiencing drug-induced delirium on top of his underlying Alzheimer's dementia. When patients with underlying vulnerable brain as a result of dementia receive benzodiazepines and/or antihistamines, they can have profound mental status changes and delirium, which  complicates their picture. It does not appear that he had any seizure activity despite the elevated white cell count and no other source of infection. The history is not suggestive of seizures that are witnessed. Right leg pain may be related to the hip, the knee, or some sort of lumbosacral radiculopathy.   At this  point, I recommend holding all benzodiazepines, antihistamines, and any antipsychotics and await their slow removal from the body and eventual return to baseline slowly over the next few days. I do recommend doing an EEG only if there is witnessed seizure activity.   For his Alzheimer's dementia, his Aricept should be increased to 10 mg daily, and his Namenda should be increased to 10 mg b.i.d. The Namenda is not involved in any of his mental status changes.   For his leg pain, consideration can be given to doing a CT scan of the lumbosacral spine to look for fractures or signs of radiculopathy. MRI would have been preferred, but he cannot have that because of his pacemaker. This test can be done as an outpatient as well when his mental status is more baseline.   ____________________________ Weston SettleShervin Constantine Ruddick, MD se:jcm D: 02/27/2014 15:59:14 ET T: 02/27/2014 16:31:13 ET JOB#: [id number]  cc: Weston SettleShervin Gracelynn Bircher, MD, <Dictator> Weston SettleSHERVIN Wednesday Ericsson MD ELECTRONICALLY SIGNED 04/03/2014 10:54

## 2015-02-05 NOTE — H&P (Signed)
PATIENT NAME:  Sean Kaufman, Sean Kaufman MR#:  [id number] DATE OF BIRTH:  [birth year]  DATE OF ADMISSION:  03/20/2014  REFERRING PHYSICIAN:  Dr. York Cerise.   PRIMARY CARE PHYSICIAN:  Dr. Einar Crow, [facility].   CHIEF COMPLAINT:  Nausea, vomiting, diarrhea.   HISTORY OF PRESENT ILLNESS:  An 81 year old Caucasian gentleman with past medical history of dementia, hypertension, coronary artery disease and diabetes presenting with nausea, vomiting and diarrhea, recently discharged from [facility] with discharge diagnosis of encephalopathy believed to be secondary to medications.  While at his nursing facility for the last two days duration he has been having nausea, vomiting and diarrhea.  Diarrhea described as multiple bouts of watery, vomiting described as nonbloody, nonbilious emesis, also multiple bouts.  The patient unfortunately was unable to provide any meaningful information given mental status which has been progressively declining, described by daughter at bedside as increased confusion.  Initial ER work-up revealed marked leukocytosis of 31.4 with mild urinary tract infection as well as acute kidney injury, remainder of labs are pending at this time.  Once again the patient unable to provide any meaningful information.   REVIEW OF SYSTEMS:  Unable to obtain given the patient's current mental status.   PAST MEDICAL HISTORY:  Diabetes, diet controlled, coronary artery disease, hypertension, dementia, permanent pacemaker insertion.   SOCIAL HISTORY:  Remote tobacco usage.  No alcohol or drug usage.  Currently resides at [facility] facility, apparently at baseline some confusion, however though is conversant.   FAMILY HISTORY:  No documented history of cardiovascular or pulmonary disorders.   ALLERGIES:  PREDNISONE.   HOME MEDICATIONS:  Aspirin 81 mg by mouth daily, lisinopril 40 mg by mouth daily, Flomax 0.4 mg by mouth daily, trazodone 50 mg by mouth at bedtime, Coreg 3.125 mg by  mouth twice daily, Singulair 10 mg by mouth daily, Namenda 5 mg by mouth twice daily, melatonin 3 mg by mouth at bedtime, vitamin D3 1000 international units by mouth daily, vitamin E 400 international units by mouth daily.   PHYSICAL EXAMINATION: VITAL SIGNS:  Temperature 97, heart rate 70, respirations 22, blood pressure 143/93, saturating 94% on room air.  Weight 72.6 kg, BMI 23.6.  GENERAL:  Chronically ill, weak-appearing Caucasian gentleman currently in minimal to moderate distress given mental status.  HEAD:  Normocephalic, atraumatic.  EYES:  Pupils equal, round, reactive to light.  Extraocular muscles intact.  No scleral icterus.   MOUTH:  Marked dry mucosal membranes.  Dentition poor.  No abscess noted.  EAR, NOSE, THROAT:  Throat clear without exudates.  No external lesions.  NECK:  Supple.  No thyromegaly.  No nodules.  No JVD.  PULMONARY:  Clear to auscultation bilaterally without wheezes, rales or rhonchi.  No use of accessory muscles.  Good respiratory effort.  Chest nontender to palpation.  CARDIOVASCULAR:  S1 and S2, regular rate and rhythm.  No murmurs, rubs or gallops.  No edema.  Pedal pulses 2+ bilaterally. GASTROINTESTINAL:  Soft nontender, nondistended.  No masses.  Positive bowel sounds.  No hepatosplenomegaly.  MUSCULOSKELETAL:  No swelling, clubbing or edema.  Range of motion passively full in all extremities.  NEUROLOGICALLY:  Unable to fully assess given the patient's current mental status.  He does, however, spontaneously move all four extremities as well as babble incoherently, however does not follow any commands.  SKIN:  No ulceration, lesions, rash, or cyanosis.  Skin warm, dry.  Turgor intact.  PSYCHIATRIC:  Unable to fully assess given the patient's current mental status,  medical condition.  He is rather somnolent, though does awake occasionally to verbal and to painful stimuli.  He babbles incoherently, but unable to provide any meaningful information.    LABORATORY DATA:   LABORATORY DATA:  Sodium 137, potassium 4.2, chloride 113, bicarb 17, anion gap of 7, BUN 69, creatinine 1.56, glucose 224.  LFTs:  Albumin of 2.9; otherwise within normal limits.  WBC 31.4, hemoglobin 15.2, platelets of 406.  Urinalysis, WBC 17, RBC 7, leukocyte esterase 2+, nitrates negative, epithelial cells none.  Lactic acid of 1.  CT abdomen and pelvis performed revealing diffuse ileus or gastroenteritis without definitive findings for obstruction.  Chest x-ray performed revealing subsegmental atelectasis.   ASSESSMENT AND PLAN:  An 81 year old Caucasian gentleman with past medical history of diabetes, hypertension, coronary artery disease, presenting with nausea, vomiting, diarrhea.  1.  Acute kidney injury, likely prerenal given lab data as well as history, provide intravenous fluid hydration with normal saline.  Follow urine output and renal function.  2.  Sepsis:  Meeting septic criteria by respiratory rate and leukocytosis.  Panculture including blood, urine, we will send stool for Clostridium difficile.  Broad antibiotic coverage, so far received vancomycin and Zosyn, continue those and follow culture data.  If Clostridium difficile positive, will require by mouth vancomycin given acute kidney injury and leukocytosis greater than 15.  3.  Diabetes.  Continue all the by mouth agents.  Add insulin sliding scale with q. 6 hour Accu-Cheks.  Goal blood glucose 120 to 180.  4.  Coronary artery disease.  Continue with aspirin, statin and beta-blockade.  5.  Venous thromboembolism prophylaxis with heparin subQ.  6.  CODE STATUS:  THE PATIENT IS DO NOT RESUSCITATE.   TIME SPENT:  45 minutes.    ____________________________ Cletis Athensavid K. Delainey Winstanley, MD dkh:ea D: 03/20/2014 02:36:00 ET T: 03/20/2014 03:22:21 ET JOB#: [id number]  cc: Cletis Athensavid K. Perpetua Elling, MD, <Dictator> Bertel Venard Synetta ShadowK Lucero Auzenne MD ELECTRONICALLY SIGNED 03/20/2014 20:47

## 2015-02-05 NOTE — H&P (Signed)
PATIENT NAME:  Sean Kaufman, HANDEL MR#:  [id number] DATE OF BIRTH:  April 29, 1927  DATE OF ADMISSION:  02/26/2014  REASON FOR ADMISSION: Weakness and change in mental status.   PRIMARY CARE PHYSICIAN:  REFERRING PHYSICIAN: Glennie Isle, MD  HISTORY OF PRESENT ILLNESS:  An 81 year old gentleman with history of confusion, altered mental status in the past, also psoriasis, dementia, diabetes, permanent pacemaker, hypertension, comes with history of being really active from his normal stage of health up until the beginning of this week. He has been exercising 3, sometimes 5 times a week and he has really good strength. Recently, he has been doing more work of the lower extremity legs with some weights. Apparently, on Monday, he started complaining of right leg pain which has been progressing up until the point that he is not able to ambulate. He went to see his PCP because of the leg dragging after several maneuvers. He felt like the main reason that he is having the pain was bursitis and prescribed some Lidoderm patches that were not approved by the insurance. The patient continued to have pain and Tuesday he was not able to walk well.  He needed significant assistance. On Wednesday, he dragged himself down with really bad posture, with his legs bended and buckled out every time that he wanted to walk and by today he was not able to really ambulate well. The patient's daughter states that this morning he felt strong enough to get up to the bathroom. Whenever she heard some noise, went to see the patient and he was lying down on the floor trying to get up. The patient states that he had increase of pain on his lower back and lower extremities when the daughter asked him if something was hurting.  Apparently he has been having some cough for 2 weeks, but mostly dry without any phlegm.  The patient is admitted for evaluation of this pain and weakness, but also he is more confused than usual and agitated.  REVIEW OF  SYSTEMS: A 12 system review is unable to be done as the patient is very lethargic.   PAST MEDICAL HISTORY: 1.  Dementia.  2.  Psoriasis.  3.  Diet-controlled diabetes.  4.  History of heart block status post permanent pacemaker.  5.  Hypertension.  6.  Dry eye syndrome.  7.  Hearing loss.  8.  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  9.  Cataracts.   PAST SURGICAL HISTORY: 1.  Permanent pacemaker.  2.  Cataract surgery.   ALLERGIES: PREDNISONE.   SOCIAL HISTORY: The patient used to smoke as a young adult, quit in 1969. Does not drink, does not use drugs. He lives with his wife.   FAMILY HISTORY: Mother died from complications of pneumonia. Father died from alcohol abuse.   CURRENT MEDICATIONS: Include vitamin B, vitamin D3, trazodone 50 mg once daily, tamsulosin 0.4 mg daily, Singulair 10 mg daily, Namenda 5 mg twice daily, lisinopril 40 mg daily, donepezil 5 mg daily, Coreg 3.125 mg 2 times daily, aspirin 81 mg daily.   PHYSICAL EXAMINATION: VITAL SIGNS: Blood pressure 189/98, respirations 20, temperature 97.7, pulse 82.  GENERAL: The patient is lethargic, mumble some words, at commands opens his eyes occasionally but then falls asleep again.  HEENT:  His pupils are equal and reactive. Extraocular movements are intact. He tracks down some. Anicteric sclerae. Pink conjunctivae. No oral lesions. No oropharyngeal exudates.  NECK: Supple. No JVD. No thyromegaly. No adenopathy.  No carotid bruits.  CARDIOVASCULAR: Regular rate and rhythm. No murmurs, rubs or gallops are appreciated. No displacement of PMI.  LUNGS: Decreased respiratory sounds in both bases. Difficult examination.  This patient moves and turns whenever he is simulated. ABDOMEN: Soft, nontender, nondistended. No hepatosplenomegaly. No masses. Bowel sounds are positive.  GENITAL: Deferred.  EXTREMITIES: No edema, cyanosis or clubbing. Pulses +2. Capillary refill less than 3.  SKIN: No  rash or petechiae, decreased turgor.  LYMPHATIC: Negative for lymphadenopathy in the neck or supraclavicular areas.  MUSCULOSKELETAL: No joint effusions or joint swelling. When the patient is checked for pain on the hips and legs, he does not seem to be in any significant discomfort. Internal rotation, external rotation, elevation of the lower extremities were done, passive maneuvers. The patient did not have any significant discomfort. Grinding of both hips when relaxed did not create any pain.  NEUROLOGIC: Unable to fully establish but the patient moves all extremities equally and withdraws to pain.  PSYCHIATRIC: The patient is very lethargic, not interactive at this moment.  LABORATORY DATA:  X-rays of the ankle show no acute abnormalities. X-ray of the knee, no acute abnormality. X-rays of the hip, no acute abnormality here.  CT of the head: No significant acute problems seen.  His urinalysis not showing any signs of urinary tract infection. White count is 11.7. His hemoglobin is 15. His platelet count is 285. Troponin is negative. Bilirubin is 1.4 but has always being elevated to some degree, sometimes up to 2.  Last time was 1.7. Glucose 142, ISSING TEXT>> 137,   ASSESSMENT AND PLAN: An 81 year old gentleman with history of dementia, hypertension, psoriasis, comes with difficulty getting around, falls, weakness and now falling.  The patient is admitted for evaluation.  1.  Weakness. The patient had significant weakness with falls. At this moment it is not quite sure what the etiology is. I cannot get a good examination as the patient is lethargic, very obtunded after medications given for agitation. The family does not want to give him anything like Geodon or Haldol, but they are okay with low doses of lorazepam. Consider the possibility of compression fractures and compression of the spinal canal radiating to the lower extremities. We are going to get x-rays of the lumbar spine, although I am not  able to get them done, as the patient would not cooperative. We are going to try to do them in the morning whenever he is more awake and more cooperative. We are going to get physical therapy services to evaluate him. At this moment, most of his laboratory work is unremarkable. No signs of major deficiencies. 2.   He is already on vitamin D and vitamin B supplementation.  3.  Continue to observe. Rule out the possibility of infectious disorders like urinary tract infection. His urinalysis is overall normal. Consider the possibility of pneumonia, as the patient has been coughing for over 2 weeks. He is not febrile and his white blood cell is elevated at 11.7. The x-ray has not been done here in the Emergency Room.  We are going to order one stat. 4.  As far as his diabetes, he is on good control. At this moment, blood sugar is 142. We are going to continue to monitor insulin sliding scale5.  Hypertension. His blood pressure seems to be elevated. The patient is not taking his blood pressure medications. We are going to restart right now.  6.  Deep vein thrombosis prophylaxis with heparin and gastrointestinal prophylaxis with  Pepcid.  I spent about 45 minutes with this admission.     ____________________________ Felipa Furnaceoberto Sanchez Gutierrez, MD rsg:ce D: 02/26/2014 18:20:57 ET T: 02/26/2014 18:45:18 ET JOB#: [id number]  cc: Felipa Furnaceoberto Sanchez Gutierrez, MD, <Dictator> Khaleb Broz Juanda ChanceSANCHEZ GUTIERRE MD ELECTRONICALLY SIGNED 03/05/2014 22:41

## 2015-03-10 ENCOUNTER — Encounter
Admission: RE | Admit: 2015-03-10 | Discharge: 2015-03-10 | Disposition: A | Discharge status: Home / Self Care | Payer: Medicare Other | Source: Ambulatory Visit | Attending: Internal Medicine | Admitting: Internal Medicine

## 2015-03-16 ENCOUNTER — Encounter
Admission: RE | Admit: 2015-03-16 | Discharge: 2015-03-16 | Disposition: A | Discharge status: Home / Self Care | Payer: Medicare Other | Source: Ambulatory Visit | Attending: Internal Medicine | Admitting: Internal Medicine

## 2015-03-16 DIAGNOSIS — G309 Alzheimer's disease, unspecified: Secondary | ICD-10-CM | POA: Insufficient documentation

## 2015-04-06 ENCOUNTER — Other Ambulatory Visit
Admission: RE | Admit: 2015-04-06 | Discharge: 2015-04-06 | Disposition: A | Discharge status: Home / Self Care | Payer: Medicare Other | Source: Skilled Nursing Facility | Attending: Gerontology | Admitting: Gerontology

## 2015-04-06 DIAGNOSIS — R410 Disorientation, unspecified: Secondary | ICD-10-CM | POA: Diagnosis present

## 2015-04-06 DIAGNOSIS — R4182 Altered mental status, unspecified: Secondary | ICD-10-CM | POA: Insufficient documentation

## 2015-04-06 LAB — URINALYSIS COMPLETE WITH MICROSCOPIC (ARMC ONLY)
Bilirubin Urine: NEGATIVE
KETONES UR: NEGATIVE mg/dL
Nitrite: NEGATIVE
Protein, ur: 30 mg/dL — AB
SPECIFIC GRAVITY, URINE: 1.022 (ref 1.005–1.030)
Squamous Epithelial / LPF: NONE SEEN
pH: 6 (ref 5.0–8.0)

## 2015-04-07 ENCOUNTER — Other Ambulatory Visit
Admission: RE | Admit: 2015-04-07 | Discharge: 2015-04-07 | Disposition: A | Discharge status: Home / Self Care | Payer: Medicare Other | Source: Ambulatory Visit | Attending: Internal Medicine | Admitting: Internal Medicine

## 2015-04-07 DIAGNOSIS — G309 Alzheimer's disease, unspecified: Secondary | ICD-10-CM | POA: Diagnosis present

## 2015-04-07 DIAGNOSIS — R350 Frequency of micturition: Secondary | ICD-10-CM | POA: Insufficient documentation

## 2015-04-07 LAB — CBC WITH DIFFERENTIAL/PLATELET
Basophils Absolute: 0 10*3/uL (ref 0–0.1)
Basophils Relative: 0 %
EOS PCT: 0 %
Eosinophils Absolute: 0 10*3/uL (ref 0–0.7)
HCT: 39.5 % — ABNORMAL LOW (ref 40.0–52.0)
Hemoglobin: 13.3 g/dL (ref 13.0–18.0)
LYMPHS ABS: 0.7 10*3/uL — AB (ref 1.0–3.6)
LYMPHS PCT: 3 %
MCH: 30.2 pg (ref 26.0–34.0)
MCHC: 33.7 g/dL (ref 32.0–36.0)
MCV: 89.7 fL (ref 80.0–100.0)
Monocytes Absolute: 2 10*3/uL — ABNORMAL HIGH (ref 0.2–1.0)
Monocytes Relative: 9 %
NEUTROS ABS: 19.9 10*3/uL — AB (ref 1.4–6.5)
Neutrophils Relative %: 88 %
PLATELETS: 210 10*3/uL (ref 150–440)
RBC: 4.41 MIL/uL (ref 4.40–5.90)
RDW: 13.9 % (ref 11.5–14.5)
WBC: 22.6 10*3/uL — ABNORMAL HIGH (ref 3.8–10.6)

## 2015-04-07 LAB — COMPREHENSIVE METABOLIC PANEL
ALBUMIN: 2.8 g/dL — AB (ref 3.5–5.0)
ALT: 38 U/L (ref 17–63)
ANION GAP: 8 (ref 5–15)
AST: 23 U/L (ref 15–41)
Alkaline Phosphatase: 71 U/L (ref 38–126)
BUN: 33 mg/dL — ABNORMAL HIGH (ref 6–20)
CALCIUM: 8.5 mg/dL — AB (ref 8.9–10.3)
CHLORIDE: 100 mmol/L — AB (ref 101–111)
CO2: 24 mmol/L (ref 22–32)
CREATININE: 1.44 mg/dL — AB (ref 0.61–1.24)
GFR calc non Af Amer: 43 mL/min — ABNORMAL LOW (ref >60)
GFR, EST AFRICAN AMERICAN: 50 mL/min — AB (ref >60)
Glucose, Bld: 165 mg/dL — ABNORMAL HIGH (ref 65–99)
Potassium: 4.2 mmol/L (ref 3.5–5.1)
Sodium: 132 mmol/L — ABNORMAL LOW (ref 135–145)
Total Bilirubin: 1.6 mg/dL — ABNORMAL HIGH (ref 0.3–1.2)
Total Protein: 5.9 g/dL — ABNORMAL LOW (ref 6.5–8.1)

## 2015-04-07 LAB — VITAMIN B12: VITAMIN B 12: 326 pg/mL (ref 180–914)

## 2015-04-07 LAB — MAGNESIUM: MAGNESIUM: 2.1 mg/dL (ref 1.7–2.4)

## 2015-04-07 LAB — TSH: TSH: 2.109 u[IU]/mL (ref 0.350–4.500)

## 2015-04-09 LAB — URINE CULTURE: Culture: >=100000

## 2015-04-12 DIAGNOSIS — G309 Alzheimer's disease, unspecified: Secondary | ICD-10-CM | POA: Diagnosis not present

## 2015-04-12 LAB — CBC WITH DIFFERENTIAL/PLATELET
BASOS ABS: 0.1 10*3/uL (ref 0–0.1)
BASOS PCT: 1 %
Eosinophils Absolute: 0.4 10*3/uL (ref 0–0.7)
Eosinophils Relative: 4 %
HCT: 43.6 % (ref 40.0–52.0)
HEMOGLOBIN: 14.6 g/dL (ref 13.0–18.0)
LYMPHS PCT: 10 %
Lymphs Abs: 1.1 10*3/uL (ref 1.0–3.6)
MCH: 29.7 pg (ref 26.0–34.0)
MCHC: 33.4 g/dL (ref 32.0–36.0)
MCV: 88.9 fL (ref 80.0–100.0)
Monocytes Absolute: 1.3 10*3/uL — ABNORMAL HIGH (ref 0.2–1.0)
Monocytes Relative: 11 %
NEUTROS ABS: 8.6 10*3/uL — AB (ref 1.4–6.5)
Neutrophils Relative %: 74 %
PLATELETS: 300 10*3/uL (ref 150–440)
RBC: 4.91 MIL/uL (ref 4.40–5.90)
RDW: 13.6 % (ref 11.5–14.5)
WBC: 11.5 10*3/uL — ABNORMAL HIGH (ref 3.8–10.6)

## 2015-04-12 LAB — COMPREHENSIVE METABOLIC PANEL
ALT: 38 U/L (ref 17–63)
ANION GAP: 9 (ref 5–15)
AST: 38 U/L (ref 15–41)
Albumin: 2.9 g/dL — ABNORMAL LOW (ref 3.5–5.0)
Alkaline Phosphatase: 72 U/L (ref 38–126)
BILIRUBIN TOTAL: 0.9 mg/dL (ref 0.3–1.2)
BUN: 14 mg/dL (ref 6–20)
CALCIUM: 8.7 mg/dL — AB (ref 8.9–10.3)
CHLORIDE: 103 mmol/L (ref 101–111)
CO2: 25 mmol/L (ref 22–32)
CREATININE: 0.8 mg/dL (ref 0.61–1.24)
GFR calc non Af Amer: >60 mL/min (ref >60)
Glucose, Bld: 164 mg/dL — ABNORMAL HIGH (ref 65–99)
Potassium: 3.9 mmol/L (ref 3.5–5.1)
Sodium: 137 mmol/L (ref 135–145)
Total Protein: 6.5 g/dL (ref 6.5–8.1)

## 2015-04-12 LAB — VITAMIN D 1,25 DIHYDROXY
Vitamin D 1, 25 (OH)2 Total: 54 pg/mL
Vitamin D3 1, 25 (OH)2: 54 pg/mL

## 2015-04-15 ENCOUNTER — Encounter
Admission: RE | Admit: 2015-04-15 | Discharge: 2015-04-15 | Disposition: A | Discharge status: Home / Self Care | Payer: Medicare Other | Source: Ambulatory Visit | Attending: Internal Medicine | Admitting: Internal Medicine

## 2015-04-15 DIAGNOSIS — R770 Abnormality of albumin: Secondary | ICD-10-CM | POA: Insufficient documentation

## 2015-04-15 DIAGNOSIS — E46 Unspecified protein-calorie malnutrition: Secondary | ICD-10-CM | POA: Insufficient documentation

## 2015-05-03 DIAGNOSIS — E46 Unspecified protein-calorie malnutrition: Secondary | ICD-10-CM | POA: Diagnosis not present

## 2015-05-03 DIAGNOSIS — R770 Abnormality of albumin: Secondary | ICD-10-CM | POA: Diagnosis present

## 2015-05-03 LAB — COMPREHENSIVE METABOLIC PANEL
ALBUMIN: 3.2 g/dL — AB (ref 3.5–5.0)
ALK PHOS: 62 U/L (ref 38–126)
ALT: 13 U/L — ABNORMAL LOW (ref 17–63)
AST: 13 U/L — ABNORMAL LOW (ref 15–41)
Anion gap: 9 (ref 5–15)
BILIRUBIN TOTAL: 1.1 mg/dL (ref 0.3–1.2)
BUN: 19 mg/dL (ref 6–20)
CO2: 24 mmol/L (ref 22–32)
Calcium: 8.9 mg/dL (ref 8.9–10.3)
Chloride: 103 mmol/L (ref 101–111)
Creatinine, Ser: 0.82 mg/dL (ref 0.61–1.24)
GFR calc Af Amer: >60 mL/min (ref >60)
Glucose, Bld: 140 mg/dL — ABNORMAL HIGH (ref 65–99)
Potassium: 4.3 mmol/L (ref 3.5–5.1)
SODIUM: 136 mmol/L (ref 135–145)
TOTAL PROTEIN: 6.5 g/dL (ref 6.5–8.1)

## 2015-05-16 ENCOUNTER — Encounter: Admission: RE | Admit: 2015-05-16 | Payer: Medicare Other | Source: Ambulatory Visit | Admitting: Internal Medicine

## 2015-06-08 IMAGING — CT CT HEAD WITHOUT CONTRAST
4 of 7 series · 17 of 30 positions shown, 18 images · non-contrast
Comparison: 02/08/2013 CT head.

CLINICAL DATA: Fall.  Dementia.  Confusion.

EXAM:
CT HEAD WITHOUT CONTRAST
TECHNIQUE: Contiguous axial images were obtained from the base of the skull
through the vertex without contrast.

[Series 3: head bone · axial · 0.41mm/px · z∈[-9,+89]mm · 5 of 75 slices shown (1 of 2)]
[im 13/75  bone]
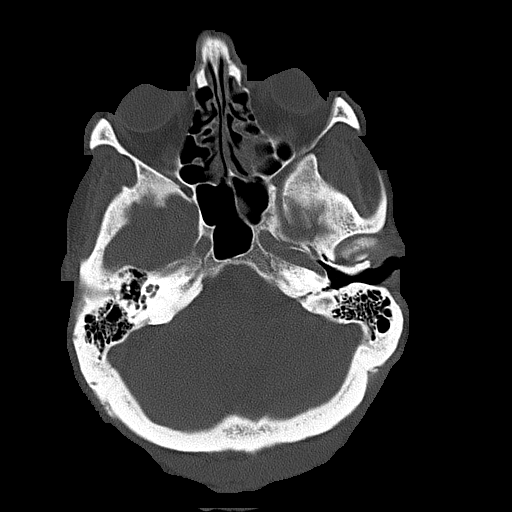
[im 25/75  bone]
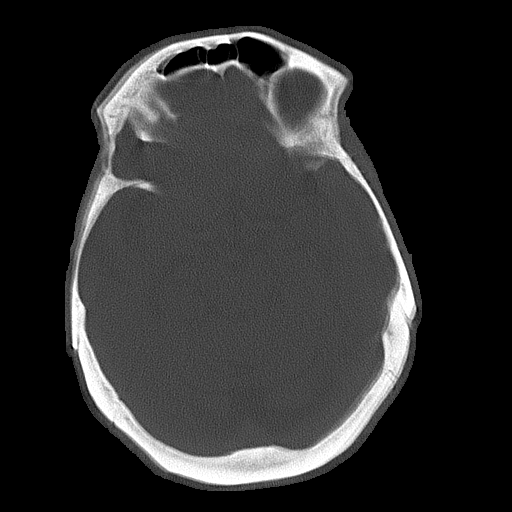
[im 38/75  bone]
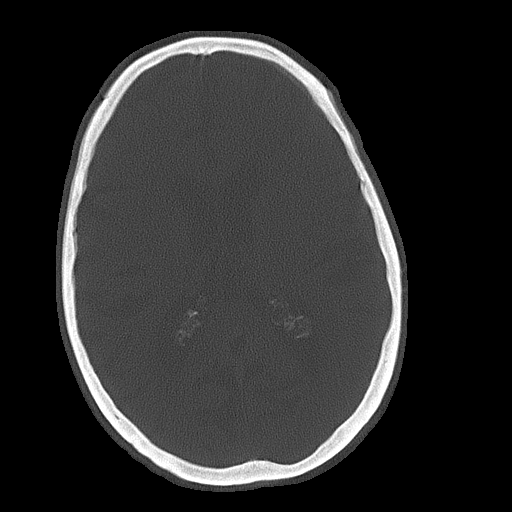
[im 50/75  bone]
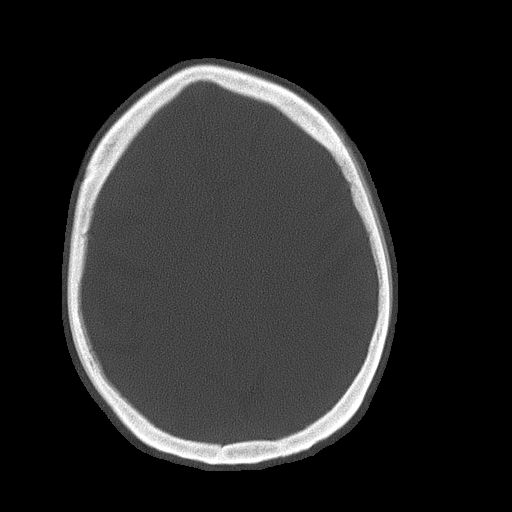
[im 62/75  bone]
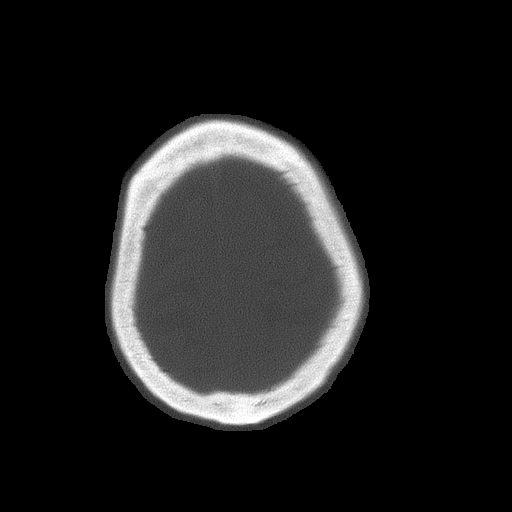

[Series 4: head wo · axial · 0.41mm/px · z∈[-33,+112]mm · 3 of 30 slices shown, 4 images]
[im 1/30  brain]
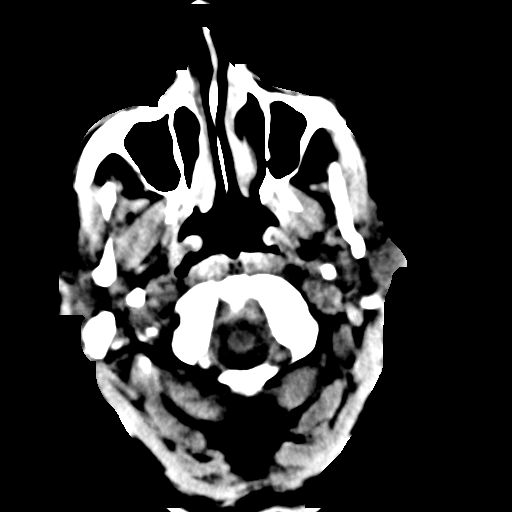
[im 1/30  bone]
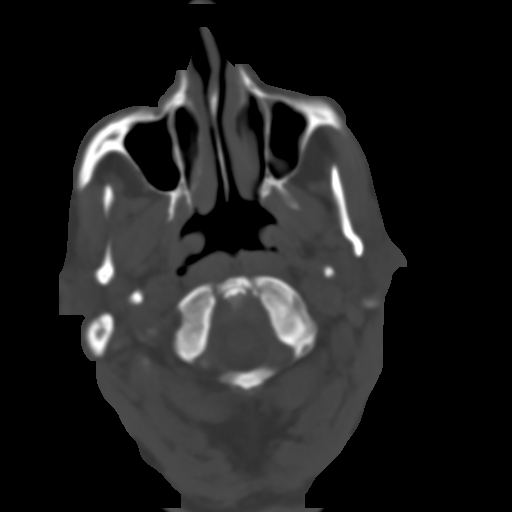
[im 15/30  brain]
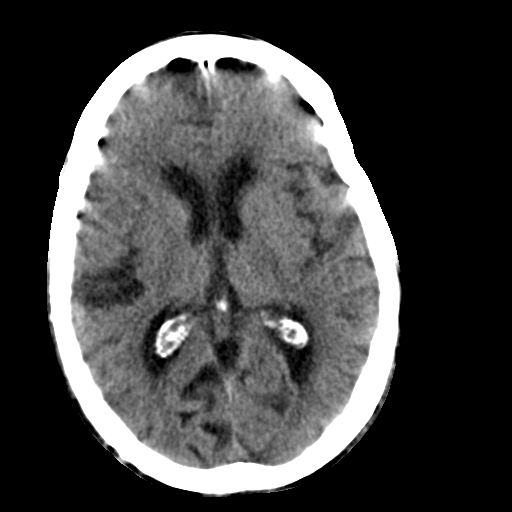
[im 30/30  brain]
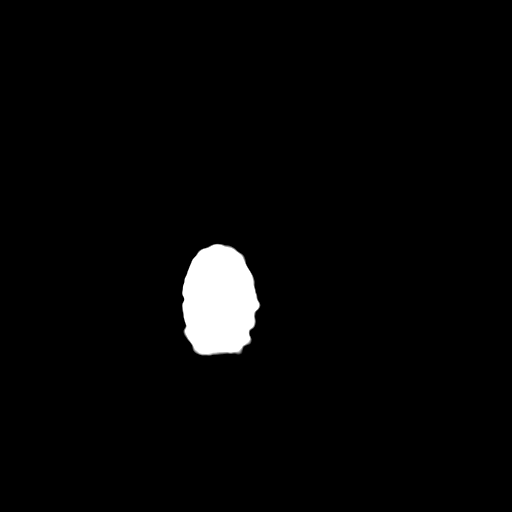

[Series 5: head bone · axial · 0.41mm/px · z∈[-9,+89]mm · 5 of 75 slices shown (2 of 2)]
[im 13/75  bone]
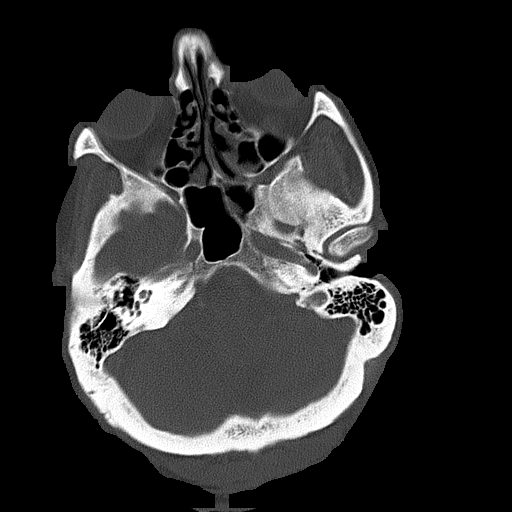
[im 25/75  bone]
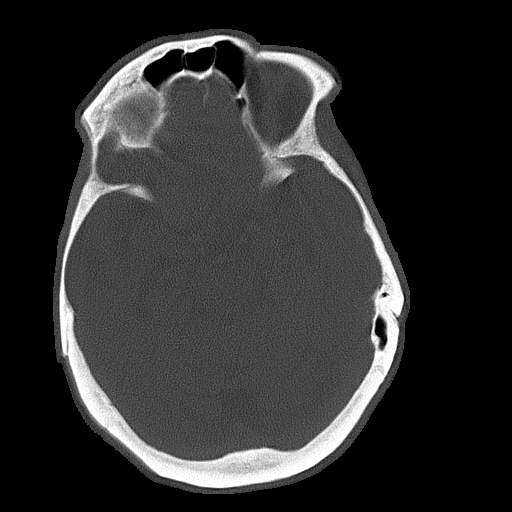
[im 38/75  bone]
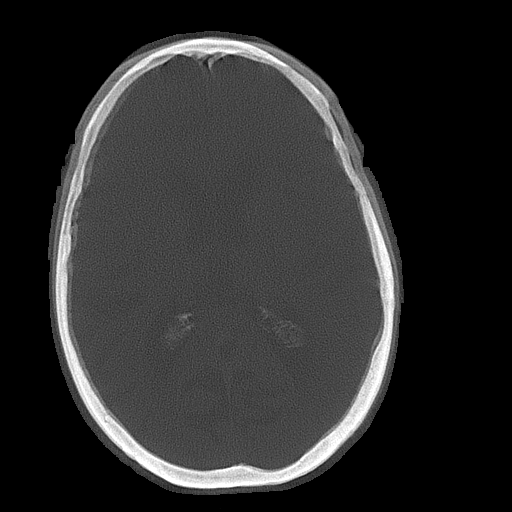
[im 50/75  bone]
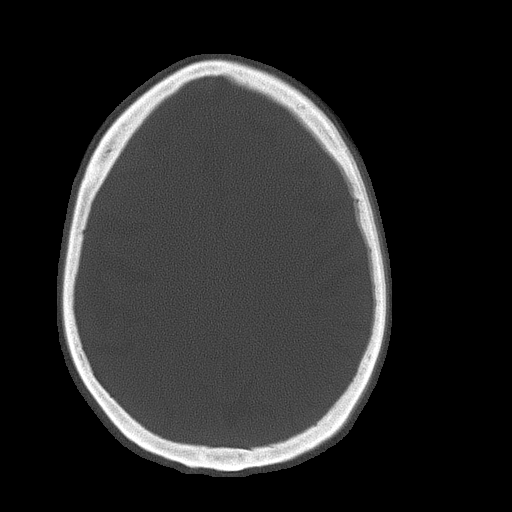
[im 62/75  bone]
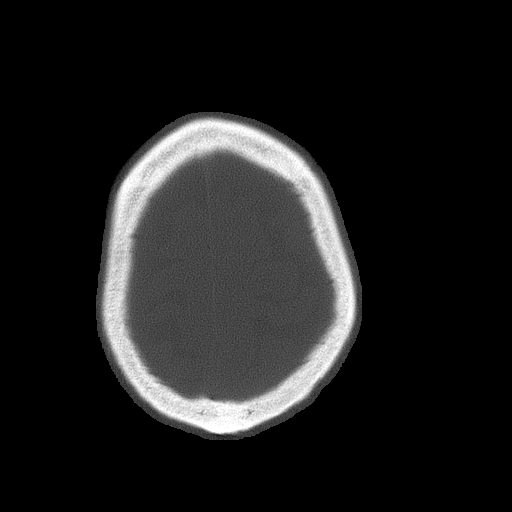

[Series 7: head bone recon · axial · 0.41mm/px · z∈[+33,+107]mm · 4 of 67 slices shown]
[im 14/67  bone]
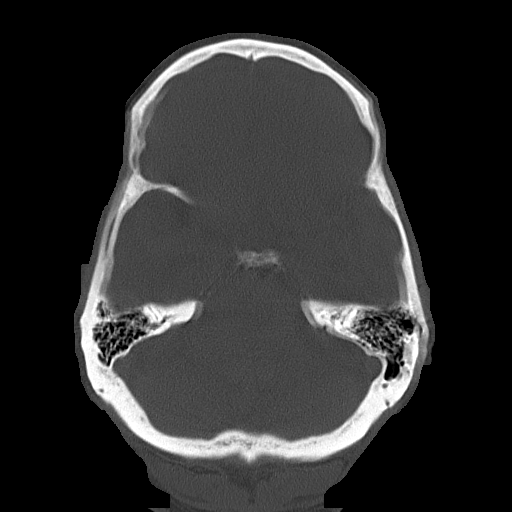
[im 27/67  bone]
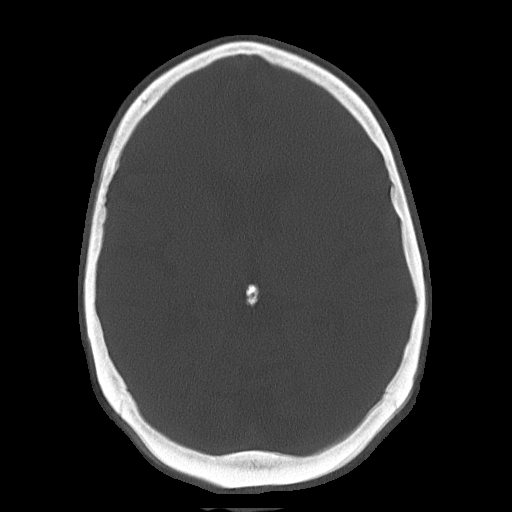
[im 40/67  bone]
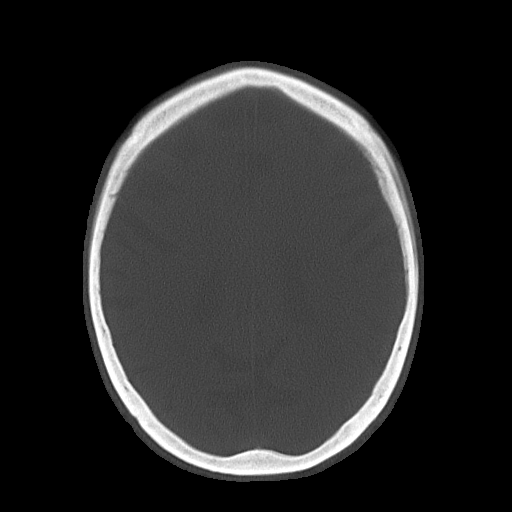
[im 53/67  bone]
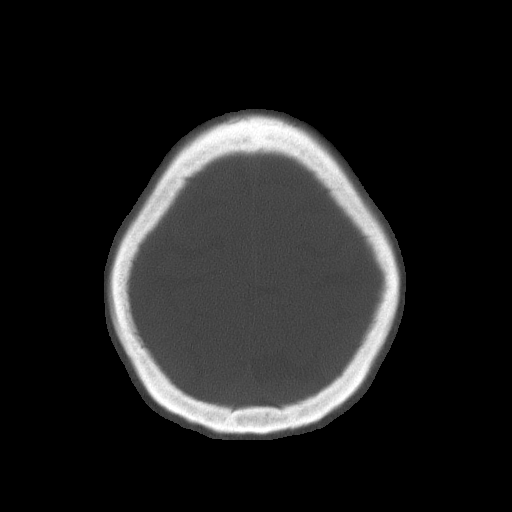

[17 of 30 positions shown; findings below may reference images not displayed]

FINDINGS: The patient was unable to remain motionless for the exam. Small or
subtle lesions could be overlooked.

No evidence for acute infarction, hemorrhage, mass lesion,
hydrocephalus, or extra-axial fluid. Generalized atrophy. Chronic
microvascular ischemic change. No skull fracture. No acute sinus
fluid accumulation. Negative mastoids.
IMPRESSION: Motion degraded exam demonstrating advanced atrophy and small vessel
disease similar to priors. No visible skull fracture or acute
intracranial process.

## 2015-06-16 ENCOUNTER — Encounter
Admission: RE | Admit: 2015-06-16 | Discharge: 2015-06-16 | Disposition: A | Discharge status: Home / Self Care | Payer: Medicare Other | Source: Ambulatory Visit | Attending: Internal Medicine | Admitting: Internal Medicine

## 2015-06-16 DIAGNOSIS — E46 Unspecified protein-calorie malnutrition: Secondary | ICD-10-CM | POA: Insufficient documentation

## 2015-06-16 DIAGNOSIS — R770 Abnormality of albumin: Secondary | ICD-10-CM | POA: Insufficient documentation

## 2015-06-30 DIAGNOSIS — R770 Abnormality of albumin: Secondary | ICD-10-CM | POA: Diagnosis present

## 2015-06-30 DIAGNOSIS — E46 Unspecified protein-calorie malnutrition: Secondary | ICD-10-CM | POA: Diagnosis present

## 2015-06-30 LAB — GLUCOSE, CAPILLARY: Glucose-Capillary: 118 mg/dL — ABNORMAL HIGH (ref 65–99)

## 2015-07-14 DIAGNOSIS — E46 Unspecified protein-calorie malnutrition: Secondary | ICD-10-CM | POA: Diagnosis not present

## 2015-07-14 LAB — GLUCOSE, CAPILLARY: Glucose-Capillary: 119 mg/dL — ABNORMAL HIGH (ref 65–99)

## 2015-07-16 ENCOUNTER — Inpatient Hospital Stay: Admission: RE | Admit: 2015-07-16 | Payer: Self-pay | Source: Ambulatory Visit | Admitting: Internal Medicine

## 2015-07-16 ENCOUNTER — Encounter
Admission: RE | Admit: 2015-07-16 | Discharge: 2015-07-16 | Disposition: A | Discharge status: Home / Self Care | Payer: Medicare Other | Source: Ambulatory Visit | Attending: Internal Medicine | Admitting: Internal Medicine

## 2015-07-16 DIAGNOSIS — E119 Type 2 diabetes mellitus without complications: Secondary | ICD-10-CM | POA: Insufficient documentation

## 2015-07-28 DIAGNOSIS — E119 Type 2 diabetes mellitus without complications: Secondary | ICD-10-CM | POA: Diagnosis present

## 2015-07-28 LAB — GLUCOSE, CAPILLARY: Glucose-Capillary: 133 mg/dL — ABNORMAL HIGH (ref 65–99)

## 2015-08-11 DIAGNOSIS — E119 Type 2 diabetes mellitus without complications: Secondary | ICD-10-CM | POA: Diagnosis not present

## 2015-08-11 LAB — GLUCOSE, CAPILLARY: Glucose-Capillary: 143 mg/dL — ABNORMAL HIGH (ref 65–99)

## 2015-08-16 ENCOUNTER — Encounter
Admission: RE | Admit: 2015-08-16 | Discharge: 2015-08-16 | Disposition: A | Discharge status: Home / Self Care | Payer: Medicare Other | Source: Ambulatory Visit | Attending: Internal Medicine | Admitting: Internal Medicine

## 2015-08-16 DIAGNOSIS — E119 Type 2 diabetes mellitus without complications: Secondary | ICD-10-CM | POA: Insufficient documentation

## 2015-08-25 DIAGNOSIS — E119 Type 2 diabetes mellitus without complications: Secondary | ICD-10-CM | POA: Diagnosis present

## 2015-08-25 LAB — GLUCOSE, CAPILLARY: Glucose-Capillary: 121 mg/dL — ABNORMAL HIGH (ref 65–99)

## 2015-09-08 DIAGNOSIS — E119 Type 2 diabetes mellitus without complications: Secondary | ICD-10-CM | POA: Diagnosis not present

## 2015-09-08 LAB — GLUCOSE, CAPILLARY: Glucose-Capillary: 138 mg/dL — ABNORMAL HIGH (ref 65–99)

## 2015-09-15 ENCOUNTER — Encounter
Admission: RE | Admit: 2015-09-15 | Discharge: 2015-09-15 | Disposition: A | Discharge status: Home / Self Care | Payer: Medicare Other | Source: Ambulatory Visit | Attending: Internal Medicine | Admitting: Internal Medicine

## 2015-10-16 ENCOUNTER — Encounter
Admission: RE | Admit: 2015-10-16 | Discharge: 2015-10-16 | Disposition: A | Discharge status: Home / Self Care | Payer: Medicare Other | Source: Ambulatory Visit | Attending: Internal Medicine | Admitting: Internal Medicine

## 2015-10-16 DIAGNOSIS — I10 Essential (primary) hypertension: Secondary | ICD-10-CM | POA: Insufficient documentation

## 2015-10-16 DIAGNOSIS — E119 Type 2 diabetes mellitus without complications: Secondary | ICD-10-CM | POA: Insufficient documentation

## 2015-10-20 DIAGNOSIS — E119 Type 2 diabetes mellitus without complications: Secondary | ICD-10-CM | POA: Diagnosis present

## 2015-10-20 DIAGNOSIS — I10 Essential (primary) hypertension: Secondary | ICD-10-CM | POA: Diagnosis not present

## 2015-10-20 LAB — GLUCOSE, CAPILLARY: Glucose-Capillary: 105 mg/dL — ABNORMAL HIGH (ref 65–99)

## 2015-10-27 DIAGNOSIS — I10 Essential (primary) hypertension: Secondary | ICD-10-CM | POA: Diagnosis not present

## 2015-10-27 LAB — BASIC METABOLIC PANEL
Anion gap: 6 (ref 5–15)
BUN: 22 mg/dL — AB (ref 6–20)
CHLORIDE: 104 mmol/L (ref 101–111)
CO2: 26 mmol/L (ref 22–32)
Calcium: 9 mg/dL (ref 8.9–10.3)
Creatinine, Ser: 0.83 mg/dL (ref 0.61–1.24)
GFR calc Af Amer: >60 mL/min (ref >60)
GLUCOSE: 163 mg/dL — AB (ref 65–99)
POTASSIUM: 4.1 mmol/L (ref 3.5–5.1)
Sodium: 136 mmol/L (ref 135–145)

## 2015-11-03 DIAGNOSIS — I10 Essential (primary) hypertension: Secondary | ICD-10-CM | POA: Diagnosis not present

## 2015-11-03 LAB — GLUCOSE, CAPILLARY: Glucose-Capillary: 122 mg/dL — ABNORMAL HIGH (ref 65–99)

## 2015-11-10 DIAGNOSIS — I10 Essential (primary) hypertension: Secondary | ICD-10-CM | POA: Diagnosis not present

## 2015-11-10 LAB — GLUCOSE, CAPILLARY: Glucose-Capillary: 156 mg/dL — ABNORMAL HIGH (ref 65–99)

## 2015-11-15 DIAGNOSIS — I10 Essential (primary) hypertension: Secondary | ICD-10-CM | POA: Diagnosis not present

## 2015-11-15 LAB — GLUCOSE, CAPILLARY: Glucose-Capillary: 180 mg/dL — ABNORMAL HIGH (ref 65–99)

## 2015-11-16 ENCOUNTER — Encounter
Admission: RE | Admit: 2015-11-16 | Discharge: 2015-11-16 | Disposition: A | Discharge status: Home / Self Care | Payer: Medicare Other | Source: Ambulatory Visit | Attending: Internal Medicine | Admitting: Internal Medicine

## 2015-11-16 DIAGNOSIS — E119 Type 2 diabetes mellitus without complications: Secondary | ICD-10-CM | POA: Insufficient documentation

## 2015-11-24 DIAGNOSIS — E119 Type 2 diabetes mellitus without complications: Secondary | ICD-10-CM | POA: Diagnosis present

## 2015-11-24 LAB — GLUCOSE, CAPILLARY: Glucose-Capillary: 208 mg/dL — ABNORMAL HIGH (ref 65–99)

## 2015-12-01 DIAGNOSIS — E119 Type 2 diabetes mellitus without complications: Secondary | ICD-10-CM | POA: Diagnosis not present

## 2015-12-01 LAB — GLUCOSE, CAPILLARY: GLUCOSE-CAPILLARY: 256 mg/dL — AB (ref 65–99)

## 2015-12-08 DIAGNOSIS — E119 Type 2 diabetes mellitus without complications: Secondary | ICD-10-CM | POA: Diagnosis not present

## 2015-12-08 LAB — GLUCOSE, CAPILLARY: Glucose-Capillary: 262 mg/dL — ABNORMAL HIGH (ref 65–99)

## 2015-12-14 ENCOUNTER — Encounter
Admission: RE | Admit: 2015-12-14 | Discharge: 2015-12-14 | Disposition: A | Discharge status: Home / Self Care | Payer: Medicare Other | Source: Ambulatory Visit | Attending: Internal Medicine | Admitting: Internal Medicine

## 2015-12-14 DIAGNOSIS — R410 Disorientation, unspecified: Secondary | ICD-10-CM | POA: Insufficient documentation

## 2015-12-14 DIAGNOSIS — I959 Hypotension, unspecified: Secondary | ICD-10-CM | POA: Insufficient documentation

## 2015-12-14 DIAGNOSIS — E119 Type 2 diabetes mellitus without complications: Secondary | ICD-10-CM | POA: Insufficient documentation

## 2015-12-15 DIAGNOSIS — E119 Type 2 diabetes mellitus without complications: Secondary | ICD-10-CM | POA: Diagnosis not present

## 2015-12-15 DIAGNOSIS — I959 Hypotension, unspecified: Secondary | ICD-10-CM | POA: Diagnosis not present

## 2015-12-15 DIAGNOSIS — R410 Disorientation, unspecified: Secondary | ICD-10-CM | POA: Diagnosis present

## 2015-12-15 LAB — TSH: TSH: 2.864 u[IU]/mL (ref 0.350–4.500)

## 2015-12-15 LAB — COMPREHENSIVE METABOLIC PANEL
ALBUMIN: 3.1 g/dL — AB (ref 3.5–5.0)
ALT: 27 U/L (ref 17–63)
ANION GAP: 6 (ref 5–15)
AST: 21 U/L (ref 15–41)
Alkaline Phosphatase: 86 U/L (ref 38–126)
BUN: 42 mg/dL — ABNORMAL HIGH (ref 6–20)
CO2: 26 mmol/L (ref 22–32)
Calcium: 9 mg/dL (ref 8.9–10.3)
Chloride: 105 mmol/L (ref 101–111)
Creatinine, Ser: 1.14 mg/dL (ref 0.61–1.24)
GFR calc non Af Amer: 56 mL/min — ABNORMAL LOW (ref >60)
GLUCOSE: 329 mg/dL — AB (ref 65–99)
POTASSIUM: 4.7 mmol/L (ref 3.5–5.1)
SODIUM: 137 mmol/L (ref 135–145)
Total Bilirubin: 0.9 mg/dL (ref 0.3–1.2)
Total Protein: 6.7 g/dL (ref 6.5–8.1)

## 2015-12-15 LAB — CBC WITH DIFFERENTIAL/PLATELET
BASOS PCT: 1 %
Basophils Absolute: 0.1 10*3/uL (ref 0–0.1)
EOS ABS: 0.3 10*3/uL (ref 0–0.7)
EOS PCT: 3 %
HCT: 40.2 % (ref 40.0–52.0)
Hemoglobin: 13.7 g/dL (ref 13.0–18.0)
Lymphocytes Relative: 9 %
Lymphs Abs: 1.1 10*3/uL (ref 1.0–3.6)
MCH: 30.1 pg (ref 26.0–34.0)
MCHC: 34.2 g/dL (ref 32.0–36.0)
MCV: 87.9 fL (ref 80.0–100.0)
MONO ABS: 1 10*3/uL (ref 0.2–1.0)
MONOS PCT: 8 %
NEUTROS PCT: 79 %
Neutro Abs: 10 10*3/uL — ABNORMAL HIGH (ref 1.4–6.5)
PLATELETS: 283 10*3/uL (ref 150–440)
RBC: 4.57 MIL/uL (ref 4.40–5.90)
RDW: 13.5 % (ref 11.5–14.5)
WBC: 12.5 10*3/uL — ABNORMAL HIGH (ref 3.8–10.6)

## 2015-12-16 LAB — GLUCOSE, CAPILLARY: Glucose-Capillary: 276 mg/dL — ABNORMAL HIGH (ref 65–99)

## 2015-12-24 DIAGNOSIS — E119 Type 2 diabetes mellitus without complications: Secondary | ICD-10-CM | POA: Diagnosis not present

## 2015-12-24 LAB — URINALYSIS COMPLETE WITH MICROSCOPIC (ARMC ONLY)
BILIRUBIN URINE: NEGATIVE
Ketones, ur: NEGATIVE mg/dL
NITRITE: NEGATIVE
PH: 5 (ref 5.0–8.0)
Protein, ur: 100 mg/dL — AB
Specific Gravity, Urine: 1.017 (ref 1.005–1.030)

## 2015-12-26 LAB — URINE CULTURE

## 2015-12-30 DIAGNOSIS — E119 Type 2 diabetes mellitus without complications: Secondary | ICD-10-CM | POA: Diagnosis not present

## 2015-12-30 LAB — COMPREHENSIVE METABOLIC PANEL
ALBUMIN: 2.7 g/dL — AB (ref 3.5–5.0)
ALK PHOS: 71 U/L (ref 38–126)
ALT: 43 U/L (ref 17–63)
ANION GAP: 8 (ref 5–15)
AST: 27 U/L (ref 15–41)
BILIRUBIN TOTAL: 1.1 mg/dL (ref 0.3–1.2)
BUN: 78 mg/dL — AB (ref 6–20)
CALCIUM: 8.8 mg/dL — AB (ref 8.9–10.3)
CO2: 20 mmol/L — AB (ref 22–32)
CREATININE: 1.54 mg/dL — AB (ref 0.61–1.24)
Chloride: 111 mmol/L (ref 101–111)
GFR calc Af Amer: 45 mL/min — ABNORMAL LOW (ref >60)
GFR calc non Af Amer: 39 mL/min — ABNORMAL LOW (ref >60)
GLUCOSE: 134 mg/dL — AB (ref 65–99)
Potassium: 4.6 mmol/L (ref 3.5–5.1)
SODIUM: 139 mmol/L (ref 135–145)
TOTAL PROTEIN: 6.4 g/dL — AB (ref 6.5–8.1)

## 2016-01-06 DIAGNOSIS — E119 Type 2 diabetes mellitus without complications: Secondary | ICD-10-CM | POA: Diagnosis not present

## 2016-01-07 DIAGNOSIS — E119 Type 2 diabetes mellitus without complications: Secondary | ICD-10-CM | POA: Diagnosis not present

## 2016-01-07 LAB — GLUCOSE, CAPILLARY
GLUCOSE-CAPILLARY: 197 mg/dL — AB (ref 65–99)
GLUCOSE-CAPILLARY: 137 mg/dL — AB (ref 65–99)
Glucose-Capillary: 119 mg/dL — ABNORMAL HIGH (ref 65–99)
Glucose-Capillary: 230 mg/dL — ABNORMAL HIGH (ref 65–99)

## 2016-01-08 DIAGNOSIS — E119 Type 2 diabetes mellitus without complications: Secondary | ICD-10-CM | POA: Diagnosis not present

## 2016-01-08 LAB — GLUCOSE, CAPILLARY
GLUCOSE-CAPILLARY: 263 mg/dL — AB (ref 65–99)
Glucose-Capillary: 143 mg/dL — ABNORMAL HIGH (ref 65–99)
Glucose-Capillary: 188 mg/dL — ABNORMAL HIGH (ref 65–99)

## 2016-01-09 DIAGNOSIS — E119 Type 2 diabetes mellitus without complications: Secondary | ICD-10-CM | POA: Diagnosis not present

## 2016-01-09 LAB — GLUCOSE, CAPILLARY
GLUCOSE-CAPILLARY: 160 mg/dL — AB (ref 65–99)
GLUCOSE-CAPILLARY: 152 mg/dL — AB (ref 65–99)
GLUCOSE-CAPILLARY: 101 mg/dL — AB (ref 65–99)
GLUCOSE-CAPILLARY: 98 mg/dL (ref 65–99)
GLUCOSE-CAPILLARY: 138 mg/dL — AB (ref 65–99)
GLUCOSE-CAPILLARY: 179 mg/dL — AB (ref 65–99)
GLUCOSE-CAPILLARY: 239 mg/dL — AB (ref 65–99)
GLUCOSE-CAPILLARY: 110 mg/dL — AB (ref 65–99)
GLUCOSE-CAPILLARY: 207 mg/dL — AB (ref 65–99)
Glucose-Capillary: 145 mg/dL — ABNORMAL HIGH (ref 65–99)
Glucose-Capillary: 146 mg/dL — ABNORMAL HIGH (ref 65–99)
Glucose-Capillary: 143 mg/dL — ABNORMAL HIGH (ref 65–99)
Glucose-Capillary: 126 mg/dL — ABNORMAL HIGH (ref 65–99)
Glucose-Capillary: 53 mg/dL — ABNORMAL LOW (ref 65–99)
Glucose-Capillary: 54 mg/dL — ABNORMAL LOW (ref 65–99)
Glucose-Capillary: 150 mg/dL — ABNORMAL HIGH (ref 65–99)
Glucose-Capillary: 145 mg/dL — ABNORMAL HIGH (ref 65–99)
Glucose-Capillary: 194 mg/dL — ABNORMAL HIGH (ref 65–99)
Glucose-Capillary: 227 mg/dL — ABNORMAL HIGH (ref 65–99)
Glucose-Capillary: 184 mg/dL — ABNORMAL HIGH (ref 65–99)

## 2016-01-10 LAB — GLUCOSE, CAPILLARY
GLUCOSE-CAPILLARY: 196 mg/dL — AB (ref 65–99)
GLUCOSE-CAPILLARY: 235 mg/dL — AB (ref 65–99)
Glucose-Capillary: 286 mg/dL — ABNORMAL HIGH (ref 65–99)
Glucose-Capillary: 152 mg/dL — ABNORMAL HIGH (ref 65–99)
Glucose-Capillary: 160 mg/dL — ABNORMAL HIGH (ref 65–99)

## 2016-01-11 DIAGNOSIS — E119 Type 2 diabetes mellitus without complications: Secondary | ICD-10-CM | POA: Diagnosis not present

## 2016-01-11 LAB — GLUCOSE, CAPILLARY
GLUCOSE-CAPILLARY: 179 mg/dL — AB (ref 65–99)
GLUCOSE-CAPILLARY: 235 mg/dL — AB (ref 65–99)
Glucose-Capillary: 191 mg/dL — ABNORMAL HIGH (ref 65–99)
Glucose-Capillary: 177 mg/dL — ABNORMAL HIGH (ref 65–99)

## 2016-01-12 DIAGNOSIS — E119 Type 2 diabetes mellitus without complications: Secondary | ICD-10-CM | POA: Diagnosis not present

## 2016-01-12 LAB — GLUCOSE, CAPILLARY
GLUCOSE-CAPILLARY: 100 mg/dL — AB (ref 65–99)
GLUCOSE-CAPILLARY: 176 mg/dL — AB (ref 65–99)
GLUCOSE-CAPILLARY: 160 mg/dL — AB (ref 65–99)
GLUCOSE-CAPILLARY: 218 mg/dL — AB (ref 65–99)

## 2016-01-13 DIAGNOSIS — E119 Type 2 diabetes mellitus without complications: Secondary | ICD-10-CM | POA: Diagnosis not present

## 2016-01-13 LAB — GLUCOSE, CAPILLARY
GLUCOSE-CAPILLARY: 153 mg/dL — AB (ref 65–99)
Glucose-Capillary: 90 mg/dL (ref 65–99)
Glucose-Capillary: 139 mg/dL — ABNORMAL HIGH (ref 65–99)
Glucose-Capillary: 135 mg/dL — ABNORMAL HIGH (ref 65–99)

## 2016-01-14 ENCOUNTER — Encounter
Admission: RE | Admit: 2016-01-14 | Discharge: 2016-01-14 | Disposition: A | Discharge status: Home / Self Care | Payer: Medicare Other | Source: Ambulatory Visit | Attending: Internal Medicine | Admitting: Internal Medicine

## 2016-01-14 DIAGNOSIS — E119 Type 2 diabetes mellitus without complications: Secondary | ICD-10-CM | POA: Insufficient documentation

## 2016-01-14 LAB — GLUCOSE, CAPILLARY
GLUCOSE-CAPILLARY: 173 mg/dL — AB (ref 65–99)
Glucose-Capillary: 142 mg/dL — ABNORMAL HIGH (ref 65–99)
Glucose-Capillary: 156 mg/dL — ABNORMAL HIGH (ref 65–99)

## 2016-01-15 DIAGNOSIS — E119 Type 2 diabetes mellitus without complications: Secondary | ICD-10-CM | POA: Diagnosis not present

## 2016-01-15 LAB — GLUCOSE, CAPILLARY
GLUCOSE-CAPILLARY: 179 mg/dL — AB (ref 65–99)
GLUCOSE-CAPILLARY: 187 mg/dL — AB (ref 65–99)
Glucose-Capillary: 119 mg/dL — ABNORMAL HIGH (ref 65–99)
Glucose-Capillary: 135 mg/dL — ABNORMAL HIGH (ref 65–99)

## 2016-01-16 DIAGNOSIS — E119 Type 2 diabetes mellitus without complications: Secondary | ICD-10-CM | POA: Diagnosis not present

## 2016-01-16 LAB — GLUCOSE, CAPILLARY: Glucose-Capillary: 120 mg/dL — ABNORMAL HIGH (ref 65–99)

## 2016-01-17 DIAGNOSIS — E119 Type 2 diabetes mellitus without complications: Secondary | ICD-10-CM | POA: Diagnosis not present

## 2016-01-17 LAB — GLUCOSE, CAPILLARY
Glucose-Capillary: 179 mg/dL — ABNORMAL HIGH (ref 65–99)
Glucose-Capillary: 129 mg/dL — ABNORMAL HIGH (ref 65–99)
Glucose-Capillary: 226 mg/dL — ABNORMAL HIGH (ref 65–99)
Glucose-Capillary: 151 mg/dL — ABNORMAL HIGH (ref 65–99)

## 2016-01-18 LAB — GLUCOSE, CAPILLARY
GLUCOSE-CAPILLARY: 155 mg/dL — AB (ref 65–99)
GLUCOSE-CAPILLARY: 176 mg/dL — AB (ref 65–99)
GLUCOSE-CAPILLARY: 196 mg/dL — AB (ref 65–99)
Glucose-Capillary: 286 mg/dL — ABNORMAL HIGH (ref 65–99)
Glucose-Capillary: 245 mg/dL — ABNORMAL HIGH (ref 65–99)
Glucose-Capillary: 122 mg/dL — ABNORMAL HIGH (ref 65–99)
Glucose-Capillary: 229 mg/dL — ABNORMAL HIGH (ref 65–99)

## 2016-01-19 DIAGNOSIS — E119 Type 2 diabetes mellitus without complications: Secondary | ICD-10-CM | POA: Diagnosis not present

## 2016-01-19 LAB — GLUCOSE, CAPILLARY
GLUCOSE-CAPILLARY: 168 mg/dL — AB (ref 65–99)
GLUCOSE-CAPILLARY: 130 mg/dL — AB (ref 65–99)
GLUCOSE-CAPILLARY: 156 mg/dL — AB (ref 65–99)
Glucose-Capillary: 163 mg/dL — ABNORMAL HIGH (ref 65–99)

## 2016-01-20 DIAGNOSIS — E119 Type 2 diabetes mellitus without complications: Secondary | ICD-10-CM | POA: Diagnosis not present

## 2016-01-20 LAB — GLUCOSE, CAPILLARY
GLUCOSE-CAPILLARY: 95 mg/dL (ref 65–99)
GLUCOSE-CAPILLARY: 125 mg/dL — AB (ref 65–99)
GLUCOSE-CAPILLARY: 289 mg/dL — AB (ref 65–99)
GLUCOSE-CAPILLARY: 217 mg/dL — AB (ref 65–99)

## 2016-01-21 DIAGNOSIS — E119 Type 2 diabetes mellitus without complications: Secondary | ICD-10-CM | POA: Diagnosis not present

## 2016-01-21 LAB — GLUCOSE, CAPILLARY: GLUCOSE-CAPILLARY: 145 mg/dL — AB (ref 65–99)

## 2016-01-22 LAB — GLUCOSE, CAPILLARY
GLUCOSE-CAPILLARY: 125 mg/dL — AB (ref 65–99)
GLUCOSE-CAPILLARY: 198 mg/dL — AB (ref 65–99)
GLUCOSE-CAPILLARY: 174 mg/dL — AB (ref 65–99)
GLUCOSE-CAPILLARY: 76 mg/dL (ref 65–99)

## 2016-01-23 DIAGNOSIS — E119 Type 2 diabetes mellitus without complications: Secondary | ICD-10-CM | POA: Diagnosis not present

## 2016-01-23 LAB — GLUCOSE, CAPILLARY
GLUCOSE-CAPILLARY: 156 mg/dL — AB (ref 65–99)
GLUCOSE-CAPILLARY: 175 mg/dL — AB (ref 65–99)
GLUCOSE-CAPILLARY: 161 mg/dL — AB (ref 65–99)
Glucose-Capillary: 92 mg/dL (ref 65–99)
Glucose-Capillary: 166 mg/dL — ABNORMAL HIGH (ref 65–99)
Glucose-Capillary: 172 mg/dL — ABNORMAL HIGH (ref 65–99)
Glucose-Capillary: 221 mg/dL — ABNORMAL HIGH (ref 65–99)

## 2016-01-24 DIAGNOSIS — E119 Type 2 diabetes mellitus without complications: Secondary | ICD-10-CM | POA: Diagnosis not present

## 2016-01-24 LAB — GLUCOSE, CAPILLARY
GLUCOSE-CAPILLARY: 162 mg/dL — AB (ref 65–99)
Glucose-Capillary: 126 mg/dL — ABNORMAL HIGH (ref 65–99)
Glucose-Capillary: 168 mg/dL — ABNORMAL HIGH (ref 65–99)
Glucose-Capillary: 162 mg/dL — ABNORMAL HIGH (ref 65–99)

## 2016-01-25 DIAGNOSIS — E119 Type 2 diabetes mellitus without complications: Secondary | ICD-10-CM | POA: Diagnosis not present

## 2016-01-25 LAB — GLUCOSE, CAPILLARY
GLUCOSE-CAPILLARY: 85 mg/dL (ref 65–99)
GLUCOSE-CAPILLARY: 161 mg/dL — AB (ref 65–99)
Glucose-Capillary: 80 mg/dL (ref 65–99)
Glucose-Capillary: 138 mg/dL — ABNORMAL HIGH (ref 65–99)

## 2016-01-26 DIAGNOSIS — E119 Type 2 diabetes mellitus without complications: Secondary | ICD-10-CM | POA: Diagnosis not present

## 2016-01-26 LAB — GLUCOSE, CAPILLARY
GLUCOSE-CAPILLARY: 100 mg/dL — AB (ref 65–99)
Glucose-Capillary: 118 mg/dL — ABNORMAL HIGH (ref 65–99)
Glucose-Capillary: 175 mg/dL — ABNORMAL HIGH (ref 65–99)
Glucose-Capillary: 211 mg/dL — ABNORMAL HIGH (ref 65–99)

## 2016-01-27 DIAGNOSIS — E119 Type 2 diabetes mellitus without complications: Secondary | ICD-10-CM | POA: Diagnosis not present

## 2016-01-27 LAB — GLUCOSE, CAPILLARY
GLUCOSE-CAPILLARY: 82 mg/dL (ref 65–99)
GLUCOSE-CAPILLARY: 79 mg/dL (ref 65–99)
GLUCOSE-CAPILLARY: 194 mg/dL — AB (ref 65–99)
GLUCOSE-CAPILLARY: 122 mg/dL — AB (ref 65–99)
Glucose-Capillary: 99 mg/dL (ref 65–99)

## 2016-01-28 DIAGNOSIS — E119 Type 2 diabetes mellitus without complications: Secondary | ICD-10-CM | POA: Diagnosis not present

## 2016-01-28 LAB — GLUCOSE, CAPILLARY
GLUCOSE-CAPILLARY: 74 mg/dL (ref 65–99)
GLUCOSE-CAPILLARY: 162 mg/dL — AB (ref 65–99)
Glucose-Capillary: 70 mg/dL (ref 65–99)
Glucose-Capillary: 137 mg/dL — ABNORMAL HIGH (ref 65–99)

## 2016-01-29 DIAGNOSIS — E119 Type 2 diabetes mellitus without complications: Secondary | ICD-10-CM | POA: Diagnosis not present

## 2016-01-29 LAB — GLUCOSE, CAPILLARY
Glucose-Capillary: 70 mg/dL (ref 65–99)
Glucose-Capillary: 88 mg/dL (ref 65–99)
Glucose-Capillary: 101 mg/dL — ABNORMAL HIGH (ref 65–99)

## 2016-01-30 DIAGNOSIS — E119 Type 2 diabetes mellitus without complications: Secondary | ICD-10-CM | POA: Diagnosis not present

## 2016-01-30 LAB — GLUCOSE, CAPILLARY
GLUCOSE-CAPILLARY: 142 mg/dL — AB (ref 65–99)
GLUCOSE-CAPILLARY: 163 mg/dL — AB (ref 65–99)
Glucose-Capillary: 157 mg/dL — ABNORMAL HIGH (ref 65–99)
Glucose-Capillary: 182 mg/dL — ABNORMAL HIGH (ref 65–99)

## 2016-01-31 DIAGNOSIS — E119 Type 2 diabetes mellitus without complications: Secondary | ICD-10-CM | POA: Diagnosis not present

## 2016-01-31 LAB — GLUCOSE, CAPILLARY
GLUCOSE-CAPILLARY: 161 mg/dL — AB (ref 65–99)
GLUCOSE-CAPILLARY: 146 mg/dL — AB (ref 65–99)
Glucose-Capillary: 64 mg/dL — ABNORMAL LOW (ref 65–99)
Glucose-Capillary: 54 mg/dL — ABNORMAL LOW (ref 65–99)

## 2016-02-01 DIAGNOSIS — E119 Type 2 diabetes mellitus without complications: Secondary | ICD-10-CM | POA: Diagnosis not present

## 2016-02-01 LAB — GLUCOSE, CAPILLARY
GLUCOSE-CAPILLARY: 85 mg/dL (ref 65–99)
GLUCOSE-CAPILLARY: 161 mg/dL — AB (ref 65–99)
GLUCOSE-CAPILLARY: 96 mg/dL (ref 65–99)
GLUCOSE-CAPILLARY: 125 mg/dL — AB (ref 65–99)

## 2016-02-02 DIAGNOSIS — E119 Type 2 diabetes mellitus without complications: Secondary | ICD-10-CM | POA: Diagnosis not present

## 2016-02-02 LAB — GLUCOSE, CAPILLARY
GLUCOSE-CAPILLARY: 112 mg/dL — AB (ref 65–99)
GLUCOSE-CAPILLARY: 153 mg/dL — AB (ref 65–99)
GLUCOSE-CAPILLARY: 100 mg/dL — AB (ref 65–99)
GLUCOSE-CAPILLARY: 134 mg/dL — AB (ref 65–99)
Glucose-Capillary: 180 mg/dL — ABNORMAL HIGH (ref 65–99)

## 2016-02-03 DIAGNOSIS — E119 Type 2 diabetes mellitus without complications: Secondary | ICD-10-CM | POA: Diagnosis not present

## 2016-02-03 LAB — GLUCOSE, CAPILLARY
GLUCOSE-CAPILLARY: 194 mg/dL — AB (ref 65–99)
Glucose-Capillary: 106 mg/dL — ABNORMAL HIGH (ref 65–99)
Glucose-Capillary: 102 mg/dL — ABNORMAL HIGH (ref 65–99)
Glucose-Capillary: 147 mg/dL — ABNORMAL HIGH (ref 65–99)

## 2016-02-04 DIAGNOSIS — E119 Type 2 diabetes mellitus without complications: Secondary | ICD-10-CM | POA: Diagnosis not present

## 2016-02-04 LAB — GLUCOSE, CAPILLARY
GLUCOSE-CAPILLARY: 120 mg/dL — AB (ref 65–99)
GLUCOSE-CAPILLARY: 154 mg/dL — AB (ref 65–99)
GLUCOSE-CAPILLARY: 184 mg/dL — AB (ref 65–99)
Glucose-Capillary: 107 mg/dL — ABNORMAL HIGH (ref 65–99)

## 2016-02-05 DIAGNOSIS — E119 Type 2 diabetes mellitus without complications: Secondary | ICD-10-CM | POA: Diagnosis not present

## 2016-02-05 LAB — GLUCOSE, CAPILLARY
GLUCOSE-CAPILLARY: 120 mg/dL — AB (ref 65–99)
GLUCOSE-CAPILLARY: 117 mg/dL — AB (ref 65–99)
Glucose-Capillary: 112 mg/dL — ABNORMAL HIGH (ref 65–99)
Glucose-Capillary: 186 mg/dL — ABNORMAL HIGH (ref 65–99)

## 2016-02-06 DIAGNOSIS — E119 Type 2 diabetes mellitus without complications: Secondary | ICD-10-CM | POA: Diagnosis not present

## 2016-02-06 LAB — GLUCOSE, CAPILLARY
GLUCOSE-CAPILLARY: 183 mg/dL — AB (ref 65–99)
GLUCOSE-CAPILLARY: 200 mg/dL — AB (ref 65–99)
Glucose-Capillary: 83 mg/dL (ref 65–99)
Glucose-Capillary: 177 mg/dL — ABNORMAL HIGH (ref 65–99)

## 2016-02-07 DIAGNOSIS — E119 Type 2 diabetes mellitus without complications: Secondary | ICD-10-CM | POA: Diagnosis not present

## 2016-02-07 LAB — GLUCOSE, CAPILLARY
GLUCOSE-CAPILLARY: 99 mg/dL (ref 65–99)
GLUCOSE-CAPILLARY: 101 mg/dL — AB (ref 65–99)
GLUCOSE-CAPILLARY: 152 mg/dL — AB (ref 65–99)
Glucose-Capillary: 151 mg/dL — ABNORMAL HIGH (ref 65–99)

## 2016-02-08 DIAGNOSIS — E119 Type 2 diabetes mellitus without complications: Secondary | ICD-10-CM | POA: Diagnosis not present

## 2016-02-08 LAB — GLUCOSE, CAPILLARY
GLUCOSE-CAPILLARY: 65 mg/dL (ref 65–99)
Glucose-Capillary: 73 mg/dL (ref 65–99)
Glucose-Capillary: 130 mg/dL — ABNORMAL HIGH (ref 65–99)
Glucose-Capillary: 158 mg/dL — ABNORMAL HIGH (ref 65–99)

## 2016-02-09 DIAGNOSIS — E119 Type 2 diabetes mellitus without complications: Secondary | ICD-10-CM | POA: Diagnosis not present

## 2016-02-09 LAB — GLUCOSE, CAPILLARY
Glucose-Capillary: 75 mg/dL (ref 65–99)
Glucose-Capillary: 124 mg/dL — ABNORMAL HIGH (ref 65–99)
Glucose-Capillary: 171 mg/dL — ABNORMAL HIGH (ref 65–99)
Glucose-Capillary: 206 mg/dL — ABNORMAL HIGH (ref 65–99)

## 2016-02-10 DIAGNOSIS — E119 Type 2 diabetes mellitus without complications: Secondary | ICD-10-CM | POA: Diagnosis not present

## 2016-02-10 LAB — GLUCOSE, CAPILLARY
GLUCOSE-CAPILLARY: 103 mg/dL — AB (ref 65–99)
Glucose-Capillary: 88 mg/dL (ref 65–99)
Glucose-Capillary: 116 mg/dL — ABNORMAL HIGH (ref 65–99)
Glucose-Capillary: 170 mg/dL — ABNORMAL HIGH (ref 65–99)

## 2016-02-11 DIAGNOSIS — E119 Type 2 diabetes mellitus without complications: Secondary | ICD-10-CM | POA: Diagnosis not present

## 2016-02-11 LAB — GLUCOSE, CAPILLARY
GLUCOSE-CAPILLARY: 162 mg/dL — AB (ref 65–99)
Glucose-Capillary: 103 mg/dL — ABNORMAL HIGH (ref 65–99)
Glucose-Capillary: 106 mg/dL — ABNORMAL HIGH (ref 65–99)
Glucose-Capillary: 106 mg/dL — ABNORMAL HIGH (ref 65–99)
Glucose-Capillary: 170 mg/dL — ABNORMAL HIGH (ref 65–99)

## 2016-02-12 DIAGNOSIS — E119 Type 2 diabetes mellitus without complications: Secondary | ICD-10-CM | POA: Diagnosis not present

## 2016-02-12 LAB — GLUCOSE, CAPILLARY
GLUCOSE-CAPILLARY: 156 mg/dL — AB (ref 65–99)
Glucose-Capillary: 120 mg/dL — ABNORMAL HIGH (ref 65–99)
Glucose-Capillary: 100 mg/dL — ABNORMAL HIGH (ref 65–99)
Glucose-Capillary: 177 mg/dL — ABNORMAL HIGH (ref 65–99)

## 2016-02-13 ENCOUNTER — Inpatient Hospital Stay: Admission: RE | Admit: 2016-02-13 | Payer: Self-pay | Source: Ambulatory Visit | Admitting: Internal Medicine

## 2016-02-13 ENCOUNTER — Encounter
Admission: RE | Admit: 2016-02-13 | Discharge: 2016-02-13 | Disposition: A | Discharge status: Home / Self Care | Payer: Medicare Other | Source: Ambulatory Visit | Attending: Internal Medicine | Admitting: Internal Medicine

## 2016-02-13 DIAGNOSIS — E119 Type 2 diabetes mellitus without complications: Secondary | ICD-10-CM | POA: Insufficient documentation

## 2016-02-13 LAB — GLUCOSE, CAPILLARY
Glucose-Capillary: 88 mg/dL (ref 65–99)
Glucose-Capillary: 110 mg/dL — ABNORMAL HIGH (ref 65–99)
Glucose-Capillary: 146 mg/dL — ABNORMAL HIGH (ref 65–99)
Glucose-Capillary: 112 mg/dL — ABNORMAL HIGH (ref 65–99)

## 2016-03-06 DIAGNOSIS — E119 Type 2 diabetes mellitus without complications: Secondary | ICD-10-CM | POA: Diagnosis not present

## 2016-03-06 LAB — GLUCOSE, CAPILLARY
GLUCOSE-CAPILLARY: 81 mg/dL (ref 65–99)
Glucose-Capillary: 73 mg/dL (ref 65–99)

## 2016-03-07 DIAGNOSIS — E119 Type 2 diabetes mellitus without complications: Secondary | ICD-10-CM | POA: Diagnosis not present

## 2016-03-07 LAB — GLUCOSE, CAPILLARY
GLUCOSE-CAPILLARY: 153 mg/dL — AB (ref 65–99)
Glucose-Capillary: 68 mg/dL (ref 65–99)
Glucose-Capillary: 86 mg/dL (ref 65–99)

## 2016-03-08 DIAGNOSIS — E119 Type 2 diabetes mellitus without complications: Secondary | ICD-10-CM | POA: Diagnosis not present

## 2016-03-08 LAB — GLUCOSE, CAPILLARY
Glucose-Capillary: 65 mg/dL (ref 65–99)
Glucose-Capillary: 157 mg/dL — ABNORMAL HIGH (ref 65–99)

## 2016-03-09 DIAGNOSIS — E119 Type 2 diabetes mellitus without complications: Secondary | ICD-10-CM | POA: Diagnosis not present

## 2016-03-09 LAB — GLUCOSE, CAPILLARY: GLUCOSE-CAPILLARY: 162 mg/dL — AB (ref 65–99)

## 2016-03-10 DIAGNOSIS — E119 Type 2 diabetes mellitus without complications: Secondary | ICD-10-CM | POA: Diagnosis not present

## 2016-03-10 LAB — GLUCOSE, CAPILLARY
GLUCOSE-CAPILLARY: 100 mg/dL — AB (ref 65–99)
Glucose-Capillary: 152 mg/dL — ABNORMAL HIGH (ref 65–99)

## 2016-03-11 DIAGNOSIS — E119 Type 2 diabetes mellitus without complications: Secondary | ICD-10-CM | POA: Diagnosis not present

## 2016-03-11 LAB — GLUCOSE, CAPILLARY
Glucose-Capillary: 73 mg/dL (ref 65–99)
Glucose-Capillary: 222 mg/dL — ABNORMAL HIGH (ref 65–99)

## 2016-03-12 DIAGNOSIS — E119 Type 2 diabetes mellitus without complications: Secondary | ICD-10-CM | POA: Diagnosis not present

## 2016-03-12 LAB — GLUCOSE, CAPILLARY
GLUCOSE-CAPILLARY: 88 mg/dL (ref 65–99)
GLUCOSE-CAPILLARY: 78 mg/dL (ref 65–99)

## 2016-03-13 DIAGNOSIS — E119 Type 2 diabetes mellitus without complications: Secondary | ICD-10-CM | POA: Diagnosis not present

## 2016-03-13 LAB — GLUCOSE, CAPILLARY
Glucose-Capillary: 78 mg/dL (ref 65–99)
Glucose-Capillary: 142 mg/dL — ABNORMAL HIGH (ref 65–99)

## 2016-03-14 DIAGNOSIS — E119 Type 2 diabetes mellitus without complications: Secondary | ICD-10-CM | POA: Diagnosis not present

## 2016-03-14 LAB — GLUCOSE, CAPILLARY
GLUCOSE-CAPILLARY: 236 mg/dL — AB (ref 65–99)
Glucose-Capillary: 79 mg/dL (ref 65–99)

## 2016-03-15 ENCOUNTER — Encounter
Admission: RE | Admit: 2016-03-15 | Discharge: 2016-03-15 | Disposition: A | Discharge status: Home / Self Care | Payer: Medicare Other | Source: Ambulatory Visit | Attending: Internal Medicine | Admitting: Internal Medicine

## 2016-03-15 DIAGNOSIS — R410 Disorientation, unspecified: Secondary | ICD-10-CM | POA: Diagnosis not present

## 2016-03-15 LAB — GLUCOSE, CAPILLARY: GLUCOSE-CAPILLARY: 113 mg/dL — AB (ref 65–99)

## 2016-03-16 DIAGNOSIS — R410 Disorientation, unspecified: Secondary | ICD-10-CM | POA: Diagnosis not present

## 2016-03-16 LAB — GLUCOSE, CAPILLARY
GLUCOSE-CAPILLARY: 155 mg/dL — AB (ref 65–99)
Glucose-Capillary: 76 mg/dL (ref 65–99)

## 2016-03-17 DIAGNOSIS — R410 Disorientation, unspecified: Secondary | ICD-10-CM | POA: Diagnosis not present

## 2016-03-17 LAB — GLUCOSE, CAPILLARY
GLUCOSE-CAPILLARY: 89 mg/dL (ref 65–99)
Glucose-Capillary: 102 mg/dL — ABNORMAL HIGH (ref 65–99)

## 2016-03-18 DIAGNOSIS — R410 Disorientation, unspecified: Secondary | ICD-10-CM | POA: Diagnosis not present

## 2016-03-18 LAB — GLUCOSE, CAPILLARY
Glucose-Capillary: 74 mg/dL (ref 65–99)
Glucose-Capillary: 155 mg/dL — ABNORMAL HIGH (ref 65–99)

## 2016-03-19 DIAGNOSIS — R410 Disorientation, unspecified: Secondary | ICD-10-CM | POA: Diagnosis not present

## 2016-03-19 LAB — GLUCOSE, CAPILLARY
GLUCOSE-CAPILLARY: 106 mg/dL — AB (ref 65–99)
GLUCOSE-CAPILLARY: 113 mg/dL — AB (ref 65–99)

## 2016-03-20 DIAGNOSIS — R410 Disorientation, unspecified: Secondary | ICD-10-CM | POA: Diagnosis not present

## 2016-03-20 LAB — GLUCOSE, CAPILLARY
Glucose-Capillary: 79 mg/dL (ref 65–99)
Glucose-Capillary: 121 mg/dL — ABNORMAL HIGH (ref 65–99)

## 2016-03-21 DIAGNOSIS — R410 Disorientation, unspecified: Secondary | ICD-10-CM | POA: Diagnosis not present

## 2016-03-21 LAB — GLUCOSE, CAPILLARY
GLUCOSE-CAPILLARY: 68 mg/dL (ref 65–99)
Glucose-Capillary: 79 mg/dL (ref 65–99)

## 2016-03-22 DIAGNOSIS — R410 Disorientation, unspecified: Secondary | ICD-10-CM | POA: Diagnosis not present

## 2016-03-22 LAB — GLUCOSE, CAPILLARY
GLUCOSE-CAPILLARY: 82 mg/dL (ref 65–99)
GLUCOSE-CAPILLARY: 105 mg/dL — AB (ref 65–99)

## 2016-03-23 DIAGNOSIS — R410 Disorientation, unspecified: Secondary | ICD-10-CM | POA: Diagnosis not present

## 2016-03-23 LAB — GLUCOSE, CAPILLARY
GLUCOSE-CAPILLARY: 165 mg/dL — AB (ref 65–99)
Glucose-Capillary: 75 mg/dL (ref 65–99)
Glucose-Capillary: 156 mg/dL — ABNORMAL HIGH (ref 65–99)

## 2016-03-24 DIAGNOSIS — R410 Disorientation, unspecified: Secondary | ICD-10-CM | POA: Diagnosis not present

## 2016-03-24 LAB — GLUCOSE, CAPILLARY
Glucose-Capillary: 80 mg/dL (ref 65–99)
Glucose-Capillary: 211 mg/dL — ABNORMAL HIGH (ref 65–99)

## 2016-03-25 DIAGNOSIS — R410 Disorientation, unspecified: Secondary | ICD-10-CM | POA: Diagnosis not present

## 2016-03-25 LAB — GLUCOSE, CAPILLARY
GLUCOSE-CAPILLARY: 65 mg/dL (ref 65–99)
Glucose-Capillary: 190 mg/dL — ABNORMAL HIGH (ref 65–99)

## 2016-03-26 DIAGNOSIS — R410 Disorientation, unspecified: Secondary | ICD-10-CM | POA: Diagnosis not present

## 2016-03-26 LAB — GLUCOSE, CAPILLARY
GLUCOSE-CAPILLARY: 85 mg/dL (ref 65–99)
Glucose-Capillary: 71 mg/dL (ref 65–99)

## 2016-03-27 DIAGNOSIS — R410 Disorientation, unspecified: Secondary | ICD-10-CM | POA: Diagnosis not present

## 2016-03-27 LAB — GLUCOSE, CAPILLARY
GLUCOSE-CAPILLARY: 61 mg/dL — AB (ref 65–99)
GLUCOSE-CAPILLARY: 93 mg/dL (ref 65–99)

## 2016-03-28 DIAGNOSIS — R410 Disorientation, unspecified: Secondary | ICD-10-CM | POA: Diagnosis not present

## 2016-03-28 LAB — GLUCOSE, CAPILLARY
GLUCOSE-CAPILLARY: 117 mg/dL — AB (ref 65–99)
Glucose-Capillary: 108 mg/dL — ABNORMAL HIGH (ref 65–99)

## 2016-03-29 DIAGNOSIS — R410 Disorientation, unspecified: Secondary | ICD-10-CM | POA: Diagnosis not present

## 2016-03-29 LAB — GLUCOSE, CAPILLARY
GLUCOSE-CAPILLARY: 130 mg/dL — AB (ref 65–99)
Glucose-Capillary: 89 mg/dL (ref 65–99)

## 2016-03-30 DIAGNOSIS — R410 Disorientation, unspecified: Secondary | ICD-10-CM | POA: Diagnosis not present

## 2016-03-30 LAB — GLUCOSE, CAPILLARY
Glucose-Capillary: 80 mg/dL (ref 65–99)
Glucose-Capillary: 113 mg/dL — ABNORMAL HIGH (ref 65–99)

## 2016-03-31 DIAGNOSIS — R410 Disorientation, unspecified: Secondary | ICD-10-CM | POA: Diagnosis not present

## 2016-03-31 LAB — GLUCOSE, CAPILLARY
GLUCOSE-CAPILLARY: 152 mg/dL — AB (ref 65–99)
Glucose-Capillary: 60 mg/dL — ABNORMAL LOW (ref 65–99)

## 2016-04-01 DIAGNOSIS — R410 Disorientation, unspecified: Secondary | ICD-10-CM | POA: Diagnosis not present

## 2016-04-01 LAB — GLUCOSE, CAPILLARY
GLUCOSE-CAPILLARY: 205 mg/dL — AB (ref 65–99)
GLUCOSE-CAPILLARY: 141 mg/dL — AB (ref 65–99)
Glucose-Capillary: 50 mg/dL — ABNORMAL LOW (ref 65–99)
Glucose-Capillary: 50 mg/dL — ABNORMAL LOW (ref 65–99)

## 2016-04-02 DIAGNOSIS — R410 Disorientation, unspecified: Secondary | ICD-10-CM | POA: Diagnosis not present

## 2016-04-02 LAB — GLUCOSE, CAPILLARY
Glucose-Capillary: 77 mg/dL (ref 65–99)
Glucose-Capillary: 144 mg/dL — ABNORMAL HIGH (ref 65–99)

## 2016-04-03 DIAGNOSIS — R410 Disorientation, unspecified: Secondary | ICD-10-CM | POA: Diagnosis not present

## 2016-04-03 LAB — GLUCOSE, CAPILLARY
GLUCOSE-CAPILLARY: 145 mg/dL — AB (ref 65–99)
Glucose-Capillary: 175 mg/dL — ABNORMAL HIGH (ref 65–99)

## 2016-04-04 DIAGNOSIS — R410 Disorientation, unspecified: Secondary | ICD-10-CM | POA: Diagnosis not present

## 2016-04-04 LAB — GLUCOSE, CAPILLARY
GLUCOSE-CAPILLARY: 123 mg/dL — AB (ref 65–99)
Glucose-Capillary: 229 mg/dL — ABNORMAL HIGH (ref 65–99)
Glucose-Capillary: 156 mg/dL — ABNORMAL HIGH (ref 65–99)

## 2016-04-05 DIAGNOSIS — R410 Disorientation, unspecified: Secondary | ICD-10-CM | POA: Diagnosis present

## 2016-04-05 LAB — URINALYSIS COMPLETE WITH MICROSCOPIC (ARMC ONLY)
BACTERIA UA: NONE SEEN
Bilirubin Urine: NEGATIVE
Glucose, UA: NEGATIVE mg/dL
Hgb urine dipstick: NEGATIVE
Ketones, ur: NEGATIVE mg/dL
Nitrite: NEGATIVE
SPECIFIC GRAVITY, URINE: 1.015 (ref 1.005–1.030)
SQUAMOUS EPITHELIAL / LPF: NONE SEEN
pH: 8 (ref 5.0–8.0)

## 2016-04-05 LAB — GLUCOSE, CAPILLARY
GLUCOSE-CAPILLARY: 163 mg/dL — AB (ref 65–99)
Glucose-Capillary: 193 mg/dL — ABNORMAL HIGH (ref 65–99)

## 2016-04-06 DIAGNOSIS — R410 Disorientation, unspecified: Secondary | ICD-10-CM | POA: Diagnosis not present

## 2016-04-06 LAB — GLUCOSE, CAPILLARY
Glucose-Capillary: 145 mg/dL — ABNORMAL HIGH (ref 65–99)
Glucose-Capillary: 149 mg/dL — ABNORMAL HIGH (ref 65–99)

## 2016-04-07 DIAGNOSIS — R410 Disorientation, unspecified: Secondary | ICD-10-CM | POA: Diagnosis not present

## 2016-04-07 LAB — GLUCOSE, CAPILLARY
Glucose-Capillary: 131 mg/dL — ABNORMAL HIGH (ref 65–99)
Glucose-Capillary: 180 mg/dL — ABNORMAL HIGH (ref 65–99)

## 2016-04-08 DIAGNOSIS — R410 Disorientation, unspecified: Secondary | ICD-10-CM | POA: Diagnosis not present

## 2016-04-08 LAB — URINE CULTURE

## 2016-04-08 LAB — GLUCOSE, CAPILLARY
Glucose-Capillary: 215 mg/dL — ABNORMAL HIGH (ref 65–99)
Glucose-Capillary: 170 mg/dL — ABNORMAL HIGH (ref 65–99)

## 2016-04-09 DIAGNOSIS — R410 Disorientation, unspecified: Secondary | ICD-10-CM | POA: Diagnosis not present

## 2016-04-09 LAB — GLUCOSE, CAPILLARY
GLUCOSE-CAPILLARY: 127 mg/dL — AB (ref 65–99)
Glucose-Capillary: 193 mg/dL — ABNORMAL HIGH (ref 65–99)

## 2016-04-10 DIAGNOSIS — R410 Disorientation, unspecified: Secondary | ICD-10-CM | POA: Diagnosis not present

## 2016-04-10 LAB — GLUCOSE, CAPILLARY
Glucose-Capillary: 136 mg/dL — ABNORMAL HIGH (ref 65–99)
Glucose-Capillary: 199 mg/dL — ABNORMAL HIGH (ref 65–99)

## 2016-04-11 DIAGNOSIS — R410 Disorientation, unspecified: Secondary | ICD-10-CM | POA: Diagnosis not present

## 2016-04-11 LAB — GLUCOSE, CAPILLARY
Glucose-Capillary: 128 mg/dL — ABNORMAL HIGH (ref 65–99)
Glucose-Capillary: 168 mg/dL — ABNORMAL HIGH (ref 65–99)

## 2016-04-12 DIAGNOSIS — R410 Disorientation, unspecified: Secondary | ICD-10-CM | POA: Diagnosis not present

## 2016-04-12 LAB — GLUCOSE, CAPILLARY
GLUCOSE-CAPILLARY: 139 mg/dL — AB (ref 65–99)
Glucose-Capillary: 187 mg/dL — ABNORMAL HIGH (ref 65–99)

## 2016-04-13 DIAGNOSIS — R410 Disorientation, unspecified: Secondary | ICD-10-CM | POA: Diagnosis not present

## 2016-04-13 LAB — GLUCOSE, CAPILLARY
GLUCOSE-CAPILLARY: 154 mg/dL — AB (ref 65–99)
Glucose-Capillary: 117 mg/dL — ABNORMAL HIGH (ref 65–99)

## 2016-04-14 ENCOUNTER — Encounter
Admission: RE | Admit: 2016-04-14 | Discharge: 2016-04-14 | Disposition: A | Discharge status: Home / Self Care | Payer: Medicare Other | Source: Ambulatory Visit | Attending: Internal Medicine | Admitting: Internal Medicine

## 2016-04-14 DIAGNOSIS — G309 Alzheimer's disease, unspecified: Secondary | ICD-10-CM | POA: Insufficient documentation

## 2016-04-14 LAB — GLUCOSE, CAPILLARY
Glucose-Capillary: 85 mg/dL (ref 65–99)
Glucose-Capillary: 143 mg/dL — ABNORMAL HIGH (ref 65–99)

## 2016-04-23 ENCOUNTER — Non-Acute Institutional Stay (SKILLED_NURSING_FACILITY): Payer: Medicare Other | Admitting: Gerontology

## 2016-04-23 DIAGNOSIS — G309 Alzheimer's disease, unspecified: Secondary | ICD-10-CM

## 2016-04-23 DIAGNOSIS — F0280 Dementia in other diseases classified elsewhere without behavioral disturbance: Secondary | ICD-10-CM | POA: Diagnosis not present

## 2016-04-23 DIAGNOSIS — G301 Alzheimer's disease with late onset: Secondary | ICD-10-CM | POA: Diagnosis not present

## 2016-04-23 NOTE — Progress Notes (Signed)
Location:  [facility] Service:  SNF [phone]) Provider:  Toni Arthurs, NP-C  Olmedo, Guy Begin, MD  Patient Care Team: Valera Castle, MD as PCP - General  Extended Emergency Contact Information Primary Emergency Contact: Poppe,Elizabeth Address: [place], [place] [address] [place] Phone: [phone] Work Phone: [phone] Mobile Phone: [phone] Relation: Daughter Secondary Emergency Contact: Carlisle Beers Address: [place], [place] [address] Home Phone: [phone] Relation: None  Code Status:  DNR Goals of care: Advanced Directive information No flowsheet data found.   Chief Complaint  Patient presents with  . Medical Management of Chronic Issues    HPI:  [name] seen today for an acute visit for management of Alzheimer's. SLP and staff are very concerned about the signs of aspiration that daughter ignores. Pt has begun to drool excessively, "gurgles" in the throat, hiccups, coughing with po intake- despite consistency. Staff have observed daughter attempting to feed res. Despite decreased LOC/ sleeping and attempting to feed for several hours in the evening. Res has had a slow decline in his health status. Unable to obtain ROS from him as he is nonverbal now d/t dementia.   Past Medical History  Diagnosis Date  . Diabetes mellitus without complication   . Allergy   . Hearing loss   . Hypertension   . Memory loss   . Heart trouble    Past Surgical History  Procedure Laterality Date  . Pacemaker placement      Allergies  Allergen Reactions  . Prednisone       Medication List       This list is accurate as of: 04/23/16  3:38 PM.  Always use your most recent med list.               carvedilol 3.125 MG tablet  Commonly known as:  COREG     donepezil 5 MG tablet  Commonly known as:  ARICEPT     lisinopril 40 MG tablet  Commonly known as:   PRINIVIL,ZESTRIL     tamsulosin 0.4 MG Caps capsule  Commonly known as:  FLOMAX     traZODone 50 MG tablet  Commonly known as:  DESYREL     triamcinolone cream 0.1 %  Commonly known as:  KENALOG        Review of Systems  Unable to perform ROS: Dementia  Constitutional: Positive for appetite change.  HENT: Positive for congestion and drooling.   Eyes: Negative.   Respiratory: Positive for cough, choking and wheezing.   Cardiovascular: Negative.  Negative for leg swelling.  Gastrointestinal: Negative.   Genitourinary: Negative.   Musculoskeletal: Negative.   Skin: Negative.   Neurological: Positive for weakness.  Psychiatric/Behavioral: Negative.      There is no immunization history on file for this patient. Pertinent  Health Maintenance Due  Topic Date Due  . PNA vac Low Risk Adult (1 of 2 - PCV13) 08/01/1994  . INFLUENZA VACCINE  05/15/2016   No flowsheet data found. Functional Status Survey:    Filed Vitals:   04/23/16 1527  BP: 92/53  Pulse: 72  Temp: 97.6 F (36.4 C)  Resp: 20  SpO2: 95%   There is no weight on file to calculate BMI. Physical Exam  Constitutional: Vital signs are normal. He appears well-developed and well-nourished.  He appears lethargic. He is sleeping. He appears ill.  HENT:  Mouth/Throat: Oropharynx is clear and moist and mucous membranes are normal.  Eyes: Conjunctivae and lids are normal.  Neck: Trachea normal. No JVD present.  Cardiovascular: Normal rate, regular rhythm and intact distal pulses.  Exam reveals distant heart sounds and friction rub.   Murmur heard. Pulses:      Dorsalis pedis pulses are 1+ on the right side, and 1+ on the left side.  Pulmonary/Chest: Effort normal. No accessory muscle usage. No respiratory distress. He has decreased breath sounds in the left lower field. He has wheezes in the left upper field. He has no rhonchi. He has no rales.  Abdominal: Soft. Normal appearance and bowel sounds are normal. There  is no tenderness.  Neurological: He appears lethargic. He is disoriented.  Skin: Skin is warm, dry and intact. No cyanosis. There is pallor. Nails show no clubbing.  Nursing note and vitals reviewed.   Labs reviewed:  Recent Labs  10/27/15 0922 12/15/15 0924 12/30/15 1520  NA 136 137 139  K 4.1 4.7 4.6  CL 104 105 111  CO2 26 26 20*  GLUCOSE 163* 329* 134*  BUN 22* 42* 78*  CREATININE 0.83 1.14 1.54*  CALCIUM 9.0 9.0 8.8*    Recent Labs  05/03/15 1045 12/15/15 0924 12/30/15 1520  AST 13* 21 27  ALT 13* 27 43  ALKPHOS 62 86 71  BILITOT 1.1 0.9 1.1  PROT 6.5 6.7 6.4*  ALBUMIN 3.2* 3.1* 2.7*    Recent Labs  12/15/15 0924  WBC 12.5*  NEUTROABS 10.0*  HGB 13.7  HCT 40.2  MCV 87.9  PLT 283   Lab Results  Component Value Date   TSH 2.864 12/15/2015   No results found for: HGBA1C No results found for: CHOL, HDL, LDLCALC, LDLDIRECT, TRIG, CHOLHDL  Significant Diagnostic Results in last 30 days:  No results found.  Assessment/Plan 1. Late onset Alzheimer's disease without behavioral disturbance  Check labs to ensure no other source of decline  Extensive conversation with Daughter re: pt decline, disease trajectory of Alzheimer's, Decreased ability to guard airway: cough, clear throat, etc causing increased drooling, "gurgling" sound; aspiration risk; nutritional status; PEG tube/ tube feedings; the inevitable wax and wane of the disease; silent aspiration; Palliative Care; Hospice services; pleasure feeds vs nutrition needs;  impracticality of repeated CXR to "check for aspiration" and chronic abt use. Daughter is very resistant to Palliative/ Hospice services. She continued to focus on inserting a feeding tube and the possibility that this is all "just bronchitis." We also discussed quality vs quantity; pain and suffering being inflicted on loved ones by forcing feeding, insertion of PEG, etc. As well as increased risk for infection from aspiration of PEG contents,  insertion site infection, etc. Discussed the likelyhood that a surgeon would not want to insert the tube anyway with where he is at in the disease process. Daughter nodded and asked some questions, but continued to be resistant to suggestions. Staff is becoming increasingly frustrated and concerned for pt safety during feedings.   Family/ staff Communication:   Total Time: 105 minutes  Documentation: 20 minutes  Face to Face: 10 minutes  Family/Phone: 75 minutes   Labs/tests ordered:  CBC, met C, Mag+, B12, D,   Vikki Ports, NP-C Geriatrics [facility] [phone] [place], [place] [address] Cell Phone (Mon-Fri 8am-5pm):  [phone] On Call:  [phone] & follow prompts after 5pm & weekends  Office Phone:  [phone] Office Fax:  [phone]

## 2016-04-24 DIAGNOSIS — G309 Alzheimer's disease, unspecified: Secondary | ICD-10-CM | POA: Diagnosis present

## 2016-04-24 LAB — CBC WITH DIFFERENTIAL/PLATELET
Basophils Absolute: 0.1 10*3/uL (ref 0–0.1)
Basophils Relative: 1 %
Eosinophils Absolute: 0.7 10*3/uL (ref 0–0.7)
Eosinophils Relative: 8 %
HEMATOCRIT: 43.4 % (ref 40.0–52.0)
HEMOGLOBIN: 14.9 g/dL (ref 13.0–18.0)
LYMPHS ABS: 1.3 10*3/uL (ref 1.0–3.6)
LYMPHS PCT: 15 %
MCH: 29.3 pg (ref 26.0–34.0)
MCHC: 34.2 g/dL (ref 32.0–36.0)
MCV: 85.5 fL (ref 80.0–100.0)
MONOS PCT: 9 %
Monocytes Absolute: 0.8 10*3/uL (ref 0.2–1.0)
NEUTROS ABS: 5.7 10*3/uL (ref 1.4–6.5)
NEUTROS PCT: 67 %
PLATELETS: 314 10*3/uL (ref 150–440)
RBC: 5.08 MIL/uL (ref 4.40–5.90)
RDW: 14.5 % (ref 11.5–14.5)
WBC: 8.5 10*3/uL (ref 3.8–10.6)

## 2016-04-24 LAB — COMPREHENSIVE METABOLIC PANEL
ALBUMIN: 3.1 g/dL — AB (ref 3.5–5.0)
ALT: 14 U/L — AB (ref 17–63)
AST: 14 U/L — AB (ref 15–41)
Alkaline Phosphatase: 75 U/L (ref 38–126)
Anion gap: 6 (ref 5–15)
BUN: 18 mg/dL (ref 6–20)
CHLORIDE: 107 mmol/L (ref 101–111)
CO2: 27 mmol/L (ref 22–32)
CREATININE: 0.71 mg/dL (ref 0.61–1.24)
Calcium: 8.7 mg/dL — ABNORMAL LOW (ref 8.9–10.3)
GFR calc Af Amer: >60 mL/min (ref >60)
GFR calc non Af Amer: >60 mL/min (ref >60)
GLUCOSE: 94 mg/dL (ref 65–99)
POTASSIUM: 3.7 mmol/L (ref 3.5–5.1)
Sodium: 140 mmol/L (ref 135–145)
Total Bilirubin: 0.9 mg/dL (ref 0.3–1.2)
Total Protein: 6.5 g/dL (ref 6.5–8.1)

## 2016-04-24 LAB — VITAMIN B12: Vitamin B-12: 2022 pg/mL — ABNORMAL HIGH (ref 180–914)

## 2016-04-24 LAB — MAGNESIUM: Magnesium: 2 mg/dL (ref 1.7–2.4)

## 2016-04-25 LAB — VITAMIN D 25 HYDROXY (VIT D DEFICIENCY, FRACTURES): Vit D, 25-Hydroxy: 32.6 ng/mL (ref 30.0–100.0)

## 2016-05-06 DIAGNOSIS — G309 Alzheimer's disease, unspecified: Secondary | ICD-10-CM | POA: Diagnosis not present

## 2016-05-06 LAB — GLUCOSE, CAPILLARY
GLUCOSE-CAPILLARY: 101 mg/dL — AB (ref 65–99)
GLUCOSE-CAPILLARY: 214 mg/dL — AB (ref 65–99)

## 2016-05-07 DIAGNOSIS — G309 Alzheimer's disease, unspecified: Secondary | ICD-10-CM | POA: Diagnosis not present

## 2016-05-07 LAB — GLUCOSE, CAPILLARY
GLUCOSE-CAPILLARY: 92 mg/dL (ref 65–99)
GLUCOSE-CAPILLARY: 151 mg/dL — AB (ref 65–99)

## 2016-05-08 DIAGNOSIS — G309 Alzheimer's disease, unspecified: Secondary | ICD-10-CM | POA: Diagnosis not present

## 2016-05-08 LAB — GLUCOSE, CAPILLARY
GLUCOSE-CAPILLARY: 114 mg/dL — AB (ref 65–99)
Glucose-Capillary: 62 mg/dL — ABNORMAL LOW (ref 65–99)

## 2016-05-09 DIAGNOSIS — G309 Alzheimer's disease, unspecified: Secondary | ICD-10-CM | POA: Diagnosis not present

## 2016-05-09 LAB — GLUCOSE, CAPILLARY
GLUCOSE-CAPILLARY: 131 mg/dL — AB (ref 65–99)
Glucose-Capillary: 178 mg/dL — ABNORMAL HIGH (ref 65–99)

## 2016-05-10 DIAGNOSIS — G309 Alzheimer's disease, unspecified: Secondary | ICD-10-CM | POA: Diagnosis not present

## 2016-05-10 LAB — GLUCOSE, CAPILLARY
GLUCOSE-CAPILLARY: 89 mg/dL (ref 65–99)
GLUCOSE-CAPILLARY: 135 mg/dL — AB (ref 65–99)

## 2016-05-11 DIAGNOSIS — G309 Alzheimer's disease, unspecified: Secondary | ICD-10-CM | POA: Diagnosis not present

## 2016-05-11 LAB — GLUCOSE, CAPILLARY
GLUCOSE-CAPILLARY: 109 mg/dL — AB (ref 65–99)
GLUCOSE-CAPILLARY: 82 mg/dL (ref 65–99)

## 2016-05-12 DIAGNOSIS — G309 Alzheimer's disease, unspecified: Secondary | ICD-10-CM | POA: Diagnosis not present

## 2016-05-12 LAB — GLUCOSE, CAPILLARY
Glucose-Capillary: 153 mg/dL — ABNORMAL HIGH (ref 65–99)
Glucose-Capillary: 205 mg/dL — ABNORMAL HIGH (ref 65–99)

## 2016-05-13 DIAGNOSIS — G309 Alzheimer's disease, unspecified: Secondary | ICD-10-CM | POA: Diagnosis not present

## 2016-05-13 LAB — GLUCOSE, CAPILLARY
Glucose-Capillary: 93 mg/dL (ref 65–99)
Glucose-Capillary: 164 mg/dL — ABNORMAL HIGH (ref 65–99)

## 2016-05-14 DIAGNOSIS — G309 Alzheimer's disease, unspecified: Secondary | ICD-10-CM | POA: Diagnosis not present

## 2016-05-14 LAB — GLUCOSE, CAPILLARY
GLUCOSE-CAPILLARY: 141 mg/dL — AB (ref 65–99)
Glucose-Capillary: 210 mg/dL — ABNORMAL HIGH (ref 65–99)

## 2016-05-15 ENCOUNTER — Encounter
Admission: RE | Admit: 2016-05-15 | Discharge: 2016-05-15 | Disposition: A | Discharge status: Home / Self Care | Payer: Medicare Other | Source: Ambulatory Visit | Attending: Internal Medicine | Admitting: Internal Medicine

## 2016-05-15 DIAGNOSIS — E119 Type 2 diabetes mellitus without complications: Secondary | ICD-10-CM | POA: Insufficient documentation

## 2016-05-15 LAB — GLUCOSE, CAPILLARY: Glucose-Capillary: 113 mg/dL — ABNORMAL HIGH (ref 65–99)

## 2016-05-16 DIAGNOSIS — E119 Type 2 diabetes mellitus without complications: Secondary | ICD-10-CM | POA: Diagnosis not present

## 2016-05-16 LAB — GLUCOSE, CAPILLARY
Glucose-Capillary: 73 mg/dL (ref 65–99)
Glucose-Capillary: 116 mg/dL — ABNORMAL HIGH (ref 65–99)

## 2016-05-17 DIAGNOSIS — E119 Type 2 diabetes mellitus without complications: Secondary | ICD-10-CM | POA: Diagnosis not present

## 2016-05-17 LAB — GLUCOSE, CAPILLARY
Glucose-Capillary: 117 mg/dL — ABNORMAL HIGH (ref 65–99)
Glucose-Capillary: 146 mg/dL — ABNORMAL HIGH (ref 65–99)

## 2016-05-18 DIAGNOSIS — E119 Type 2 diabetes mellitus without complications: Secondary | ICD-10-CM | POA: Diagnosis not present

## 2016-05-18 LAB — GLUCOSE, CAPILLARY
GLUCOSE-CAPILLARY: 130 mg/dL — AB (ref 65–99)
Glucose-Capillary: 151 mg/dL — ABNORMAL HIGH (ref 65–99)

## 2016-05-19 DIAGNOSIS — E119 Type 2 diabetes mellitus without complications: Secondary | ICD-10-CM | POA: Diagnosis not present

## 2016-05-19 LAB — GLUCOSE, CAPILLARY
GLUCOSE-CAPILLARY: 106 mg/dL — AB (ref 65–99)
Glucose-Capillary: 121 mg/dL — ABNORMAL HIGH (ref 65–99)

## 2016-05-20 DIAGNOSIS — E119 Type 2 diabetes mellitus without complications: Secondary | ICD-10-CM | POA: Diagnosis not present

## 2016-05-20 LAB — GLUCOSE, CAPILLARY
GLUCOSE-CAPILLARY: 111 mg/dL — AB (ref 65–99)
GLUCOSE-CAPILLARY: 205 mg/dL — AB (ref 65–99)

## 2016-05-21 DIAGNOSIS — E119 Type 2 diabetes mellitus without complications: Secondary | ICD-10-CM | POA: Diagnosis not present

## 2016-05-21 LAB — GLUCOSE, CAPILLARY
GLUCOSE-CAPILLARY: 168 mg/dL — AB (ref 65–99)
Glucose-Capillary: 144 mg/dL — ABNORMAL HIGH (ref 65–99)

## 2016-05-22 DIAGNOSIS — E119 Type 2 diabetes mellitus without complications: Secondary | ICD-10-CM | POA: Diagnosis not present

## 2016-05-22 LAB — GLUCOSE, CAPILLARY
GLUCOSE-CAPILLARY: 84 mg/dL (ref 65–99)
Glucose-Capillary: 103 mg/dL — ABNORMAL HIGH (ref 65–99)

## 2016-05-23 DIAGNOSIS — E119 Type 2 diabetes mellitus without complications: Secondary | ICD-10-CM | POA: Diagnosis not present

## 2016-05-23 LAB — GLUCOSE, CAPILLARY
Glucose-Capillary: 126 mg/dL — ABNORMAL HIGH (ref 65–99)
Glucose-Capillary: 127 mg/dL — ABNORMAL HIGH (ref 65–99)

## 2016-05-24 DIAGNOSIS — E119 Type 2 diabetes mellitus without complications: Secondary | ICD-10-CM | POA: Diagnosis not present

## 2016-05-24 LAB — GLUCOSE, CAPILLARY
GLUCOSE-CAPILLARY: 111 mg/dL — AB (ref 65–99)
Glucose-Capillary: 173 mg/dL — ABNORMAL HIGH (ref 65–99)

## 2016-05-25 DIAGNOSIS — E119 Type 2 diabetes mellitus without complications: Secondary | ICD-10-CM | POA: Diagnosis not present

## 2016-05-25 LAB — GLUCOSE, CAPILLARY
GLUCOSE-CAPILLARY: 93 mg/dL (ref 65–99)
Glucose-Capillary: 107 mg/dL — ABNORMAL HIGH (ref 65–99)

## 2016-05-26 DIAGNOSIS — E119 Type 2 diabetes mellitus without complications: Secondary | ICD-10-CM | POA: Diagnosis not present

## 2016-05-26 LAB — GLUCOSE, CAPILLARY
GLUCOSE-CAPILLARY: 150 mg/dL — AB (ref 65–99)
Glucose-Capillary: 133 mg/dL — ABNORMAL HIGH (ref 65–99)

## 2016-05-27 DIAGNOSIS — E119 Type 2 diabetes mellitus without complications: Secondary | ICD-10-CM | POA: Diagnosis not present

## 2016-05-27 LAB — GLUCOSE, CAPILLARY
GLUCOSE-CAPILLARY: 113 mg/dL — AB (ref 65–99)
GLUCOSE-CAPILLARY: 154 mg/dL — AB (ref 65–99)

## 2016-05-28 DIAGNOSIS — E119 Type 2 diabetes mellitus without complications: Secondary | ICD-10-CM | POA: Diagnosis not present

## 2016-05-28 LAB — GLUCOSE, CAPILLARY
Glucose-Capillary: 110 mg/dL — ABNORMAL HIGH (ref 65–99)
Glucose-Capillary: 82 mg/dL (ref 65–99)

## 2016-05-29 DIAGNOSIS — E119 Type 2 diabetes mellitus without complications: Secondary | ICD-10-CM | POA: Diagnosis not present

## 2016-05-29 LAB — GLUCOSE, CAPILLARY
GLUCOSE-CAPILLARY: 118 mg/dL — AB (ref 65–99)
GLUCOSE-CAPILLARY: 226 mg/dL — AB (ref 65–99)

## 2016-05-30 DIAGNOSIS — E119 Type 2 diabetes mellitus without complications: Secondary | ICD-10-CM | POA: Diagnosis not present

## 2016-05-30 LAB — GLUCOSE, CAPILLARY
Glucose-Capillary: 99 mg/dL (ref 65–99)
Glucose-Capillary: 160 mg/dL — ABNORMAL HIGH (ref 65–99)

## 2016-05-31 DIAGNOSIS — E119 Type 2 diabetes mellitus without complications: Secondary | ICD-10-CM | POA: Diagnosis not present

## 2016-05-31 LAB — GLUCOSE, CAPILLARY
GLUCOSE-CAPILLARY: 134 mg/dL — AB (ref 65–99)
Glucose-Capillary: 89 mg/dL (ref 65–99)

## 2016-06-01 DIAGNOSIS — E119 Type 2 diabetes mellitus without complications: Secondary | ICD-10-CM | POA: Diagnosis not present

## 2016-06-01 LAB — GLUCOSE, CAPILLARY
Glucose-Capillary: 77 mg/dL (ref 65–99)
Glucose-Capillary: 189 mg/dL — ABNORMAL HIGH (ref 65–99)

## 2016-06-02 DIAGNOSIS — E119 Type 2 diabetes mellitus without complications: Secondary | ICD-10-CM | POA: Diagnosis not present

## 2016-06-02 LAB — GLUCOSE, CAPILLARY
GLUCOSE-CAPILLARY: 124 mg/dL — AB (ref 65–99)
Glucose-Capillary: 148 mg/dL — ABNORMAL HIGH (ref 65–99)

## 2016-06-03 DIAGNOSIS — E119 Type 2 diabetes mellitus without complications: Secondary | ICD-10-CM | POA: Diagnosis not present

## 2016-06-03 LAB — GLUCOSE, CAPILLARY
GLUCOSE-CAPILLARY: 97 mg/dL (ref 65–99)
GLUCOSE-CAPILLARY: 145 mg/dL — AB (ref 65–99)

## 2016-06-04 DIAGNOSIS — E119 Type 2 diabetes mellitus without complications: Secondary | ICD-10-CM | POA: Diagnosis not present

## 2016-06-04 LAB — GLUCOSE, CAPILLARY
GLUCOSE-CAPILLARY: 123 mg/dL — AB (ref 65–99)
GLUCOSE-CAPILLARY: 76 mg/dL (ref 65–99)

## 2016-06-05 DIAGNOSIS — E119 Type 2 diabetes mellitus without complications: Secondary | ICD-10-CM | POA: Diagnosis not present

## 2016-06-05 LAB — GLUCOSE, CAPILLARY
GLUCOSE-CAPILLARY: 151 mg/dL — AB (ref 65–99)
Glucose-Capillary: 92 mg/dL (ref 65–99)

## 2016-06-06 DIAGNOSIS — E119 Type 2 diabetes mellitus without complications: Secondary | ICD-10-CM | POA: Diagnosis not present

## 2016-06-06 LAB — GLUCOSE, CAPILLARY
GLUCOSE-CAPILLARY: 117 mg/dL — AB (ref 65–99)
GLUCOSE-CAPILLARY: 139 mg/dL — AB (ref 65–99)

## 2016-06-07 DIAGNOSIS — E119 Type 2 diabetes mellitus without complications: Secondary | ICD-10-CM | POA: Diagnosis not present

## 2016-06-07 LAB — GLUCOSE, CAPILLARY
Glucose-Capillary: 81 mg/dL (ref 65–99)
Glucose-Capillary: 104 mg/dL — ABNORMAL HIGH (ref 65–99)

## 2016-06-08 DIAGNOSIS — E119 Type 2 diabetes mellitus without complications: Secondary | ICD-10-CM | POA: Diagnosis not present

## 2016-06-08 LAB — GLUCOSE, CAPILLARY
GLUCOSE-CAPILLARY: 107 mg/dL — AB (ref 65–99)
GLUCOSE-CAPILLARY: 92 mg/dL (ref 65–99)

## 2016-06-09 DIAGNOSIS — E119 Type 2 diabetes mellitus without complications: Secondary | ICD-10-CM | POA: Diagnosis not present

## 2016-06-09 LAB — GLUCOSE, CAPILLARY
GLUCOSE-CAPILLARY: 88 mg/dL (ref 65–99)
Glucose-Capillary: 102 mg/dL — ABNORMAL HIGH (ref 65–99)

## 2016-06-10 DIAGNOSIS — E119 Type 2 diabetes mellitus without complications: Secondary | ICD-10-CM | POA: Diagnosis not present

## 2016-06-10 LAB — GLUCOSE, CAPILLARY
Glucose-Capillary: 107 mg/dL — ABNORMAL HIGH (ref 65–99)
Glucose-Capillary: 177 mg/dL — ABNORMAL HIGH (ref 65–99)

## 2016-06-11 DIAGNOSIS — E119 Type 2 diabetes mellitus without complications: Secondary | ICD-10-CM | POA: Diagnosis not present

## 2016-06-11 LAB — GLUCOSE, CAPILLARY
Glucose-Capillary: 117 mg/dL — ABNORMAL HIGH (ref 65–99)
Glucose-Capillary: 131 mg/dL — ABNORMAL HIGH (ref 65–99)

## 2016-06-12 DIAGNOSIS — E119 Type 2 diabetes mellitus without complications: Secondary | ICD-10-CM | POA: Diagnosis not present

## 2016-06-12 LAB — GLUCOSE, CAPILLARY
Glucose-Capillary: 119 mg/dL — ABNORMAL HIGH (ref 65–99)
Glucose-Capillary: 164 mg/dL — ABNORMAL HIGH (ref 65–99)

## 2016-06-13 DIAGNOSIS — E119 Type 2 diabetes mellitus without complications: Secondary | ICD-10-CM | POA: Diagnosis not present

## 2016-06-13 LAB — GLUCOSE, CAPILLARY
GLUCOSE-CAPILLARY: 68 mg/dL (ref 65–99)
Glucose-Capillary: 126 mg/dL — ABNORMAL HIGH (ref 65–99)

## 2016-06-14 DIAGNOSIS — E119 Type 2 diabetes mellitus without complications: Secondary | ICD-10-CM | POA: Diagnosis not present

## 2016-06-14 LAB — GLUCOSE, CAPILLARY
GLUCOSE-CAPILLARY: 133 mg/dL — AB (ref 65–99)
GLUCOSE-CAPILLARY: 177 mg/dL — AB (ref 65–99)

## 2016-06-15 ENCOUNTER — Encounter
Admission: RE | Admit: 2016-06-15 | Discharge: 2016-06-15 | Disposition: A | Discharge status: Home / Self Care | Payer: Medicare Other | Source: Ambulatory Visit | Attending: Internal Medicine | Admitting: Internal Medicine

## 2016-06-15 DIAGNOSIS — E119 Type 2 diabetes mellitus without complications: Secondary | ICD-10-CM | POA: Diagnosis present

## 2016-06-15 LAB — GLUCOSE, CAPILLARY
Glucose-Capillary: 117 mg/dL — ABNORMAL HIGH (ref 65–99)
Glucose-Capillary: 155 mg/dL — ABNORMAL HIGH (ref 65–99)

## 2016-06-16 DIAGNOSIS — E119 Type 2 diabetes mellitus without complications: Secondary | ICD-10-CM | POA: Diagnosis not present

## 2016-06-16 LAB — GLUCOSE, CAPILLARY
Glucose-Capillary: 119 mg/dL — ABNORMAL HIGH (ref 65–99)
Glucose-Capillary: 238 mg/dL — ABNORMAL HIGH (ref 65–99)

## 2016-06-17 DIAGNOSIS — E119 Type 2 diabetes mellitus without complications: Secondary | ICD-10-CM | POA: Diagnosis not present

## 2016-06-17 LAB — GLUCOSE, CAPILLARY
GLUCOSE-CAPILLARY: 150 mg/dL — AB (ref 65–99)
GLUCOSE-CAPILLARY: 143 mg/dL — AB (ref 65–99)

## 2016-06-18 DIAGNOSIS — E119 Type 2 diabetes mellitus without complications: Secondary | ICD-10-CM | POA: Diagnosis not present

## 2016-06-18 LAB — GLUCOSE, CAPILLARY
Glucose-Capillary: 118 mg/dL — ABNORMAL HIGH (ref 65–99)
Glucose-Capillary: 107 mg/dL — ABNORMAL HIGH (ref 65–99)

## 2016-06-19 DIAGNOSIS — E119 Type 2 diabetes mellitus without complications: Secondary | ICD-10-CM | POA: Diagnosis not present

## 2016-06-19 LAB — GLUCOSE, CAPILLARY
GLUCOSE-CAPILLARY: 152 mg/dL — AB (ref 65–99)
Glucose-Capillary: 137 mg/dL — ABNORMAL HIGH (ref 65–99)

## 2016-06-20 ENCOUNTER — Non-Acute Institutional Stay (SKILLED_NURSING_FACILITY): Payer: Medicare Other | Admitting: Gerontology

## 2016-06-20 DIAGNOSIS — R1312 Dysphagia, oropharyngeal phase: Secondary | ICD-10-CM

## 2016-06-20 DIAGNOSIS — E119 Type 2 diabetes mellitus without complications: Secondary | ICD-10-CM | POA: Diagnosis not present

## 2016-06-20 DIAGNOSIS — F0280 Dementia in other diseases classified elsewhere without behavioral disturbance: Secondary | ICD-10-CM | POA: Diagnosis not present

## 2016-06-20 DIAGNOSIS — G301 Alzheimer's disease with late onset: Secondary | ICD-10-CM | POA: Diagnosis not present

## 2016-06-20 LAB — GLUCOSE, CAPILLARY
GLUCOSE-CAPILLARY: 71 mg/dL (ref 65–99)
GLUCOSE-CAPILLARY: 183 mg/dL — AB (ref 65–99)

## 2016-06-20 NOTE — Progress Notes (Signed)
Location:      Place of Service:  SNF (31) Provider:  Lorenso Quarry, NP-C  Olmedo, Joycie Peek, MD  Patient Care Team: Dione Housekeeper, MD as PCP - General  Extended Emergency Contact Information Primary Emergency Contact: Racette,Elizabeth Address: [address]          [place], [place] [address] [place] Home Phone: [phone] Work Phone: [phone] Mobile Phone: [phone] Relation: Daughter Secondary Emergency Contact: Thressa Sheller Address: [address]          [place], [place] [address] Home Phone: [phone] Relation: None  Code Status:  DNR Goals of care: Advanced Directive information No flowsheet data found.   Chief Complaint  Patient presents with  . Medical Management of Chronic Issues    HPI:  [name] seen today for medical management of chronic diseases. Seen for late onset Alzheimer's disease without behavioral disturbance, diabetes mellitus without complication, oropharyngeal dysphagia. Sean Kaufman continues to have a very slow decline. No recent episodes of choking, cough or congestion. He's still on a very intermittent basis will sit at the desk and sing, but very less frequently than he used to. Daughter continues to come in and feed him lunch and sometimes supper on a daily basis. She has been educated on multiple occasions by multiple staff members to stop feeding Sean Kaufman if he begins to cough, choke, or fall asleep while eating. She verbalizes understanding of the risks of aspiration, but continues to feed at times. Blood sugars have been stable. Will not change insulin or insulin regimen at this time. Will defer checking A1c and micoralbumin due to Progressive age, Progressive dementia and overall General decline. Recent labs were unremarkable. Vital signs stable. No complaints. Past pressure wound on sacrum is healed.     Past Medical History:  Diagnosis Date  . Allergy   . Diabetes mellitus without complication   . Hearing  loss   . Heart trouble   . Hypertension   . Memory loss    Past Surgical History:  Procedure Laterality Date  . PACEMAKER PLACEMENT      Allergies  Allergen Reactions  . Prednisone       Medication List       Accurate as of 06/20/16 12:00 PM. Always use your most recent med list.          carvedilol 3.125 MG tablet Commonly known as:  COREG   donepezil 5 MG tablet Commonly known as:  ARICEPT   lisinopril 40 MG tablet Commonly known as:  PRINIVIL,ZESTRIL   tamsulosin 0.4 MG Caps capsule Commonly known as:  FLOMAX   traZODone 50 MG tablet Commonly known as:  DESYREL   triamcinolone cream 0.1 % Commonly known as:  KENALOG       Review of Systems  Unable to perform ROS: Dementia  Constitutional: Positive for appetite change.  HENT: Positive for drooling. Negative for congestion.   Eyes: Negative.   Respiratory: Positive for wheezing. Negative for cough and choking (not lately).   Cardiovascular: Negative.  Negative for leg swelling.  Gastrointestinal: Negative.   Genitourinary: Negative.   Musculoskeletal: Negative.   Skin: Negative.   Neurological: Positive for weakness.  Psychiatric/Behavioral: Negative.      There is no immunization history on file for this patient. Pertinent  Health Maintenance Due  Topic Date Due  . HEMOGLOBIN A1C  06/17/29  . FOOT EXAM  08/02/1939  . OPHTHALMOLOGY EXAM  08/02/1939  . PNA  vac Low Risk Adult (1 of 2 - PCV13) 08/01/1994  . INFLUENZA VACCINE  05/15/2016   No flowsheet data found. Functional Status Survey:    Vitals:   06/18/16 0500  BP: 130/68  Pulse: 74  Resp: 14  Temp: (!) 96 F (35.6 C)  SpO2: 95%  Weight: 154 lb (69.9 kg)   Body mass index is 25.63 kg/m. Physical Exam  Constitutional: Vital signs are normal. He appears well-developed and well-nourished. He appears lethargic. He is sleeping. He appears ill.  HENT:  Mouth/Throat: Oropharynx is clear and moist and mucous membranes are normal.    Eyes: Conjunctivae and lids are normal.  Neck: Trachea normal. No JVD present.  Cardiovascular: Regular rhythm and intact distal pulses.  Bradycardia present.  Exam reveals distant heart sounds. Exam reveals no friction rub.   Murmur heard. Pulses:      Dorsalis pedis pulses are 1+ on the right side, and 1+ on the left side.  Pulmonary/Chest: Effort normal. No accessory muscle usage. No respiratory distress. He has decreased breath sounds in the left lower field. He has no wheezes. He has no rhonchi. He has no rales.  Abdominal: Soft. Normal appearance and bowel sounds are normal. There is no tenderness.  Neurological: He appears lethargic. He is disoriented.  Skin: Skin is warm, dry and intact. No cyanosis. There is pallor. Nails show no clubbing.  Dried blood on Left great toe at nail bed. No obvious recent trauma or source.   Nursing note and vitals reviewed.   Labs reviewed:  Recent Labs  12/15/15 0924 12/30/15 1520 04/24/16 0725  NA 137 139 140  K 4.7 4.6 3.7  CL 105 111 107  CO2 26 20* 27  GLUCOSE 329* 134* 94  BUN 42* 78* 18  CREATININE 1.14 1.54* 0.71  CALCIUM 9.0 8.8* 8.7*  MG  --   --  2.0    Recent Labs  12/15/15 0924 12/30/15 1520 04/24/16 0725  AST 21 27 14*  ALT 27 43 14*  ALKPHOS 86 71 75  BILITOT 0.9 1.1 0.9  PROT 6.7 6.4* 6.5  ALBUMIN 3.1* 2.7* 3.1*    Recent Labs  12/15/15 0924 04/24/16 0725  WBC 12.5* 8.5  NEUTROABS 10.0* 5.7  HGB 13.7 14.9  HCT 40.2 43.4  MCV 87.9 85.5  PLT 283 314   Lab Results  Component Value Date   TSH 2.864 12/15/2015   No results found for: HGBA1C No results found for: CHOL, HDL, LDLCALC, LDLDIRECT, TRIG, CHOLHDL  Significant Diagnostic Results in last 30 days:  No results found.  Assessment/Plan 1. Late onset Alzheimer's disease without behavioral disturbance  Continue getting pt up in the chair frequently  Allow for naps in bed during the day  Encourage meals in the common areas for supervision and  stimulation  Monitor for safety  2. Diabetes mellitus without complication (HCC)  Continue to monitor FSBS  Continue on NPH at this time as BG's are stable  Consider change to toujeo if BG's become unstable  Monitor closely for hypoglycemia d/t decreased po intake  Will not test A1C and micro. D/t age/ declining health  3. Oropharyngeal dysphagia  Continue to frequently educate daughter about not force feeding pt, especially when he is falling asleep. She verbalizes she understands, but continues to feed him despite patient coughing.   Monitor closely for the above occurrences and discourage feeding at that time.  Monitor closely for s/s of aspiration pna- daughter understands this is a very real risk  and high probability.  Daughter declines SLP  Family/ staff Communication:   Total Time:  Documentation:  Face to Face:  Family/Phone: Communicate with daughter on a regular, ongoing basis due to daily presence on the unit   Labs/tests ordered:  none  Medication list reviewed and assessed for continued appropriateness. Monthly medication orders reviewed and signed.  Sean RimShannon H. Ryelee Albee, NP-C Geriatrics [facility] [phone] [place], [place] [address] Cell Phone (Mon-Fri 8am-5pm):  [phone] On Call:  [phone] & follow prompts after 5pm & weekends Office Phone:  [phone] Office Fax:  [phone]

## 2016-06-21 DIAGNOSIS — E119 Type 2 diabetes mellitus without complications: Secondary | ICD-10-CM | POA: Diagnosis not present

## 2016-06-21 LAB — GLUCOSE, CAPILLARY
GLUCOSE-CAPILLARY: 169 mg/dL — AB (ref 65–99)
Glucose-Capillary: 185 mg/dL — ABNORMAL HIGH (ref 65–99)

## 2016-06-22 DIAGNOSIS — E119 Type 2 diabetes mellitus without complications: Secondary | ICD-10-CM | POA: Diagnosis not present

## 2016-06-22 LAB — GLUCOSE, CAPILLARY
GLUCOSE-CAPILLARY: 147 mg/dL — AB (ref 65–99)
Glucose-Capillary: 127 mg/dL — ABNORMAL HIGH (ref 65–99)

## 2016-06-23 DIAGNOSIS — E119 Type 2 diabetes mellitus without complications: Secondary | ICD-10-CM | POA: Diagnosis not present

## 2016-06-23 LAB — GLUCOSE, CAPILLARY
Glucose-Capillary: 114 mg/dL — ABNORMAL HIGH (ref 65–99)
Glucose-Capillary: 151 mg/dL — ABNORMAL HIGH (ref 65–99)

## 2016-06-24 DIAGNOSIS — E119 Type 2 diabetes mellitus without complications: Secondary | ICD-10-CM | POA: Diagnosis not present

## 2016-06-24 LAB — GLUCOSE, CAPILLARY
GLUCOSE-CAPILLARY: 119 mg/dL — AB (ref 65–99)
GLUCOSE-CAPILLARY: 104 mg/dL — AB (ref 65–99)

## 2016-06-25 DIAGNOSIS — E119 Type 2 diabetes mellitus without complications: Secondary | ICD-10-CM | POA: Diagnosis not present

## 2016-06-25 LAB — GLUCOSE, CAPILLARY
Glucose-Capillary: 128 mg/dL — ABNORMAL HIGH (ref 65–99)
Glucose-Capillary: 131 mg/dL — ABNORMAL HIGH (ref 65–99)

## 2016-06-26 DIAGNOSIS — E119 Type 2 diabetes mellitus without complications: Secondary | ICD-10-CM | POA: Diagnosis not present

## 2016-06-26 LAB — GLUCOSE, CAPILLARY: Glucose-Capillary: 103 mg/dL — ABNORMAL HIGH (ref 65–99)

## 2016-06-27 DIAGNOSIS — E119 Type 2 diabetes mellitus without complications: Secondary | ICD-10-CM | POA: Diagnosis not present

## 2016-06-27 LAB — GLUCOSE, CAPILLARY
Glucose-Capillary: 107 mg/dL — ABNORMAL HIGH (ref 65–99)
Glucose-Capillary: 108 mg/dL — ABNORMAL HIGH (ref 65–99)

## 2016-06-28 DIAGNOSIS — E119 Type 2 diabetes mellitus without complications: Secondary | ICD-10-CM | POA: Diagnosis not present

## 2016-06-28 LAB — GLUCOSE, CAPILLARY
Glucose-Capillary: 143 mg/dL — ABNORMAL HIGH (ref 65–99)
Glucose-Capillary: 134 mg/dL — ABNORMAL HIGH (ref 65–99)

## 2016-06-29 DIAGNOSIS — E119 Type 2 diabetes mellitus without complications: Secondary | ICD-10-CM | POA: Diagnosis not present

## 2016-06-29 LAB — GLUCOSE, CAPILLARY
Glucose-Capillary: 145 mg/dL — ABNORMAL HIGH (ref 65–99)
Glucose-Capillary: 146 mg/dL — ABNORMAL HIGH (ref 65–99)

## 2016-06-30 DIAGNOSIS — E119 Type 2 diabetes mellitus without complications: Secondary | ICD-10-CM | POA: Diagnosis not present

## 2016-06-30 LAB — GLUCOSE, CAPILLARY
Glucose-Capillary: 137 mg/dL — ABNORMAL HIGH (ref 65–99)
Glucose-Capillary: 160 mg/dL — ABNORMAL HIGH (ref 65–99)

## 2016-07-01 DIAGNOSIS — E119 Type 2 diabetes mellitus without complications: Secondary | ICD-10-CM | POA: Diagnosis not present

## 2016-07-01 LAB — GLUCOSE, CAPILLARY
GLUCOSE-CAPILLARY: 134 mg/dL — AB (ref 65–99)
Glucose-Capillary: 121 mg/dL — ABNORMAL HIGH (ref 65–99)

## 2016-07-02 DIAGNOSIS — E119 Type 2 diabetes mellitus without complications: Secondary | ICD-10-CM | POA: Diagnosis not present

## 2016-07-02 LAB — GLUCOSE, CAPILLARY
GLUCOSE-CAPILLARY: 105 mg/dL — AB (ref 65–99)
Glucose-Capillary: 118 mg/dL — ABNORMAL HIGH (ref 65–99)

## 2016-07-03 DIAGNOSIS — E119 Type 2 diabetes mellitus without complications: Secondary | ICD-10-CM | POA: Diagnosis not present

## 2016-07-03 LAB — GLUCOSE, CAPILLARY
GLUCOSE-CAPILLARY: 194 mg/dL — AB (ref 65–99)
Glucose-Capillary: 147 mg/dL — ABNORMAL HIGH (ref 65–99)

## 2016-07-04 DIAGNOSIS — E119 Type 2 diabetes mellitus without complications: Secondary | ICD-10-CM | POA: Diagnosis not present

## 2016-07-04 LAB — GLUCOSE, CAPILLARY
GLUCOSE-CAPILLARY: 191 mg/dL — AB (ref 65–99)
Glucose-Capillary: 86 mg/dL (ref 65–99)

## 2016-07-05 DIAGNOSIS — E119 Type 2 diabetes mellitus without complications: Secondary | ICD-10-CM | POA: Diagnosis not present

## 2016-07-05 LAB — GLUCOSE, CAPILLARY
GLUCOSE-CAPILLARY: 117 mg/dL — AB (ref 65–99)
Glucose-Capillary: 61 mg/dL — ABNORMAL LOW (ref 65–99)
Glucose-Capillary: 146 mg/dL — ABNORMAL HIGH (ref 65–99)

## 2016-07-06 DIAGNOSIS — E119 Type 2 diabetes mellitus without complications: Secondary | ICD-10-CM | POA: Diagnosis not present

## 2016-07-06 LAB — GLUCOSE, CAPILLARY
GLUCOSE-CAPILLARY: 76 mg/dL (ref 65–99)
GLUCOSE-CAPILLARY: 77 mg/dL (ref 65–99)

## 2016-07-07 DIAGNOSIS — E119 Type 2 diabetes mellitus without complications: Secondary | ICD-10-CM | POA: Diagnosis not present

## 2016-07-07 LAB — GLUCOSE, CAPILLARY
GLUCOSE-CAPILLARY: 111 mg/dL — AB (ref 65–99)
Glucose-Capillary: 111 mg/dL — ABNORMAL HIGH (ref 65–99)

## 2016-07-08 DIAGNOSIS — E119 Type 2 diabetes mellitus without complications: Secondary | ICD-10-CM | POA: Diagnosis not present

## 2016-07-08 LAB — GLUCOSE, CAPILLARY
GLUCOSE-CAPILLARY: 154 mg/dL — AB (ref 65–99)
GLUCOSE-CAPILLARY: 85 mg/dL (ref 65–99)

## 2016-07-09 DIAGNOSIS — E119 Type 2 diabetes mellitus without complications: Secondary | ICD-10-CM | POA: Diagnosis not present

## 2016-07-09 LAB — GLUCOSE, CAPILLARY
GLUCOSE-CAPILLARY: 97 mg/dL (ref 65–99)
Glucose-Capillary: 118 mg/dL — ABNORMAL HIGH (ref 65–99)

## 2016-07-10 DIAGNOSIS — E119 Type 2 diabetes mellitus without complications: Secondary | ICD-10-CM | POA: Diagnosis not present

## 2016-07-10 LAB — GLUCOSE, CAPILLARY
Glucose-Capillary: 122 mg/dL — ABNORMAL HIGH (ref 65–99)
Glucose-Capillary: 180 mg/dL — ABNORMAL HIGH (ref 65–99)

## 2016-07-11 DIAGNOSIS — E119 Type 2 diabetes mellitus without complications: Secondary | ICD-10-CM | POA: Diagnosis not present

## 2016-07-11 LAB — GLUCOSE, CAPILLARY
GLUCOSE-CAPILLARY: 129 mg/dL — AB (ref 65–99)
Glucose-Capillary: 105 mg/dL — ABNORMAL HIGH (ref 65–99)

## 2016-07-12 DIAGNOSIS — E119 Type 2 diabetes mellitus without complications: Secondary | ICD-10-CM | POA: Diagnosis not present

## 2016-07-12 LAB — GLUCOSE, CAPILLARY: GLUCOSE-CAPILLARY: 118 mg/dL — AB (ref 65–99)

## 2016-07-13 DIAGNOSIS — E119 Type 2 diabetes mellitus without complications: Secondary | ICD-10-CM | POA: Diagnosis not present

## 2016-07-13 LAB — GLUCOSE, CAPILLARY: GLUCOSE-CAPILLARY: 147 mg/dL — AB (ref 65–99)

## 2016-07-14 DIAGNOSIS — E119 Type 2 diabetes mellitus without complications: Secondary | ICD-10-CM | POA: Diagnosis not present

## 2016-07-14 LAB — GLUCOSE, CAPILLARY: GLUCOSE-CAPILLARY: 99 mg/dL (ref 65–99)

## 2016-07-15 ENCOUNTER — Encounter
Admission: RE | Admit: 2016-07-15 | Discharge: 2016-07-15 | Disposition: A | Discharge status: Home / Self Care | Payer: Medicare Other | Source: Ambulatory Visit | Attending: Internal Medicine | Admitting: Internal Medicine

## 2016-07-15 DIAGNOSIS — E119 Type 2 diabetes mellitus without complications: Secondary | ICD-10-CM | POA: Diagnosis not present

## 2016-07-15 DIAGNOSIS — R1312 Dysphagia, oropharyngeal phase: Secondary | ICD-10-CM | POA: Diagnosis not present

## 2016-07-15 LAB — GLUCOSE, CAPILLARY
GLUCOSE-CAPILLARY: 124 mg/dL — AB (ref 65–99)
GLUCOSE-CAPILLARY: 101 mg/dL — AB (ref 65–99)

## 2016-07-23 DIAGNOSIS — F0280 Dementia in other diseases classified elsewhere without behavioral disturbance: Secondary | ICD-10-CM | POA: Diagnosis not present

## 2016-07-23 DIAGNOSIS — Z66 Do not resuscitate: Secondary | ICD-10-CM | POA: Diagnosis not present

## 2016-07-23 DIAGNOSIS — G8929 Other chronic pain: Secondary | ICD-10-CM | POA: Diagnosis not present

## 2016-07-23 DIAGNOSIS — I10 Essential (primary) hypertension: Secondary | ICD-10-CM | POA: Diagnosis not present

## 2016-07-23 DIAGNOSIS — R1310 Dysphagia, unspecified: Secondary | ICD-10-CM | POA: Diagnosis not present

## 2016-07-23 DIAGNOSIS — Z8673 Personal history of transient ischemic attack (TIA), and cerebral infarction without residual deficits: Secondary | ICD-10-CM | POA: Diagnosis not present

## 2016-07-23 DIAGNOSIS — G309 Alzheimer's disease, unspecified: Secondary | ICD-10-CM | POA: Diagnosis not present

## 2016-07-23 DIAGNOSIS — E119 Type 2 diabetes mellitus without complications: Secondary | ICD-10-CM | POA: Diagnosis not present

## 2016-07-23 DIAGNOSIS — I25810 Atherosclerosis of coronary artery bypass graft(s) without angina pectoris: Secondary | ICD-10-CM | POA: Diagnosis not present

## 2016-07-23 DIAGNOSIS — Z951 Presence of aortocoronary bypass graft: Secondary | ICD-10-CM | POA: Diagnosis not present

## 2016-07-23 DIAGNOSIS — Z515 Encounter for palliative care: Secondary | ICD-10-CM | POA: Diagnosis not present

## 2016-08-06 DIAGNOSIS — R1312 Dysphagia, oropharyngeal phase: Secondary | ICD-10-CM | POA: Diagnosis not present

## 2016-08-06 LAB — GLUCOSE, CAPILLARY
Glucose-Capillary: 163 mg/dL — ABNORMAL HIGH (ref 65–99)
Glucose-Capillary: 161 mg/dL — ABNORMAL HIGH (ref 65–99)

## 2016-08-07 DIAGNOSIS — R1312 Dysphagia, oropharyngeal phase: Secondary | ICD-10-CM | POA: Diagnosis not present

## 2016-08-07 LAB — GLUCOSE, CAPILLARY
GLUCOSE-CAPILLARY: 148 mg/dL — AB (ref 65–99)
GLUCOSE-CAPILLARY: 91 mg/dL (ref 65–99)

## 2016-08-08 DIAGNOSIS — R1312 Dysphagia, oropharyngeal phase: Secondary | ICD-10-CM | POA: Diagnosis not present

## 2016-08-08 LAB — GLUCOSE, CAPILLARY
Glucose-Capillary: 142 mg/dL — ABNORMAL HIGH (ref 65–99)
Glucose-Capillary: 58 mg/dL — ABNORMAL LOW (ref 65–99)

## 2016-08-09 ENCOUNTER — Non-Acute Institutional Stay (SKILLED_NURSING_FACILITY): Payer: Medicare Other | Admitting: Gerontology

## 2016-08-09 DIAGNOSIS — L308 Other specified dermatitis: Secondary | ICD-10-CM

## 2016-08-09 DIAGNOSIS — R1312 Dysphagia, oropharyngeal phase: Secondary | ICD-10-CM | POA: Diagnosis not present

## 2016-08-09 LAB — GLUCOSE, CAPILLARY
GLUCOSE-CAPILLARY: 140 mg/dL — AB (ref 65–99)
GLUCOSE-CAPILLARY: 149 mg/dL — AB (ref 65–99)

## 2016-08-10 DIAGNOSIS — R1312 Dysphagia, oropharyngeal phase: Secondary | ICD-10-CM | POA: Diagnosis not present

## 2016-08-10 LAB — GLUCOSE, CAPILLARY
Glucose-Capillary: 119 mg/dL — ABNORMAL HIGH (ref 65–99)
Glucose-Capillary: 98 mg/dL (ref 65–99)

## 2016-08-11 DIAGNOSIS — R1312 Dysphagia, oropharyngeal phase: Secondary | ICD-10-CM | POA: Diagnosis not present

## 2016-08-11 LAB — GLUCOSE, CAPILLARY
GLUCOSE-CAPILLARY: 97 mg/dL (ref 65–99)
GLUCOSE-CAPILLARY: 167 mg/dL — AB (ref 65–99)

## 2016-08-12 DIAGNOSIS — R1312 Dysphagia, oropharyngeal phase: Secondary | ICD-10-CM | POA: Diagnosis not present

## 2016-08-12 LAB — GLUCOSE, CAPILLARY
GLUCOSE-CAPILLARY: 137 mg/dL — AB (ref 65–99)
GLUCOSE-CAPILLARY: 158 mg/dL — AB (ref 65–99)

## 2016-08-13 ENCOUNTER — Non-Acute Institutional Stay (SKILLED_NURSING_FACILITY): Payer: Medicare Other | Admitting: Gerontology

## 2016-08-13 DIAGNOSIS — R4182 Altered mental status, unspecified: Secondary | ICD-10-CM | POA: Diagnosis not present

## 2016-08-13 DIAGNOSIS — R1312 Dysphagia, oropharyngeal phase: Secondary | ICD-10-CM | POA: Diagnosis not present

## 2016-08-13 LAB — COMPREHENSIVE METABOLIC PANEL
ALT: 10 U/L — AB (ref 17–63)
AST: 16 U/L (ref 15–41)
Albumin: 3.5 g/dL (ref 3.5–5.0)
Alkaline Phosphatase: 77 U/L (ref 38–126)
Anion gap: 8 (ref 5–15)
BUN: 17 mg/dL (ref 6–20)
CO2: 26 mmol/L (ref 22–32)
CREATININE: 0.95 mg/dL (ref 0.61–1.24)
Calcium: 9.3 mg/dL (ref 8.9–10.3)
Chloride: 105 mmol/L (ref 101–111)
GFR calc Af Amer: >60 mL/min (ref >60)
Glucose, Bld: 174 mg/dL — ABNORMAL HIGH (ref 65–99)
POTASSIUM: 3.6 mmol/L (ref 3.5–5.1)
Sodium: 139 mmol/L (ref 135–145)
TOTAL PROTEIN: 7.1 g/dL (ref 6.5–8.1)
Total Bilirubin: 1 mg/dL (ref 0.3–1.2)

## 2016-08-13 LAB — CBC
HEMATOCRIT: 48.6 % (ref 40.0–52.0)
Hemoglobin: 15.9 g/dL (ref 13.0–18.0)
MCH: 30 pg (ref 26.0–34.0)
MCHC: 32.7 g/dL (ref 32.0–36.0)
MCV: 91.8 fL (ref 80.0–100.0)
Platelets: 274 10*3/uL (ref 150–440)
RBC: 5.3 MIL/uL (ref 4.40–5.90)
RDW: 14.1 % (ref 11.5–14.5)
WBC: 10.9 10*3/uL — AB (ref 3.8–10.6)

## 2016-08-13 LAB — GLUCOSE, CAPILLARY
Glucose-Capillary: 152 mg/dL — ABNORMAL HIGH (ref 65–99)
Glucose-Capillary: 179 mg/dL — ABNORMAL HIGH (ref 65–99)

## 2016-08-14 DIAGNOSIS — I10 Essential (primary) hypertension: Secondary | ICD-10-CM | POA: Diagnosis not present

## 2016-08-14 DIAGNOSIS — E119 Type 2 diabetes mellitus without complications: Secondary | ICD-10-CM | POA: Diagnosis not present

## 2016-08-14 DIAGNOSIS — F0280 Dementia in other diseases classified elsewhere without behavioral disturbance: Secondary | ICD-10-CM | POA: Diagnosis not present

## 2016-08-14 DIAGNOSIS — Z951 Presence of aortocoronary bypass graft: Secondary | ICD-10-CM | POA: Diagnosis not present

## 2016-08-14 DIAGNOSIS — R1310 Dysphagia, unspecified: Secondary | ICD-10-CM | POA: Diagnosis not present

## 2016-08-14 DIAGNOSIS — I25810 Atherosclerosis of coronary artery bypass graft(s) without angina pectoris: Secondary | ICD-10-CM | POA: Diagnosis not present

## 2016-08-14 DIAGNOSIS — Z8673 Personal history of transient ischemic attack (TIA), and cerebral infarction without residual deficits: Secondary | ICD-10-CM | POA: Diagnosis not present

## 2016-08-14 DIAGNOSIS — G309 Alzheimer's disease, unspecified: Secondary | ICD-10-CM | POA: Diagnosis not present

## 2016-08-14 DIAGNOSIS — R1312 Dysphagia, oropharyngeal phase: Secondary | ICD-10-CM | POA: Diagnosis not present

## 2016-08-14 DIAGNOSIS — Z515 Encounter for palliative care: Secondary | ICD-10-CM | POA: Diagnosis not present

## 2016-08-14 DIAGNOSIS — Z66 Do not resuscitate: Secondary | ICD-10-CM | POA: Diagnosis not present

## 2016-08-14 DIAGNOSIS — R627 Adult failure to thrive: Secondary | ICD-10-CM | POA: Diagnosis not present

## 2016-08-14 LAB — GLUCOSE, CAPILLARY
GLUCOSE-CAPILLARY: 122 mg/dL — AB (ref 65–99)
Glucose-Capillary: 144 mg/dL — ABNORMAL HIGH (ref 65–99)

## 2016-08-15 ENCOUNTER — Non-Acute Institutional Stay (SKILLED_NURSING_FACILITY): Payer: Medicare Other | Admitting: Gerontology

## 2016-08-15 ENCOUNTER — Encounter
Admission: RE | Admit: 2016-08-15 | Discharge: 2016-08-15 | Disposition: A | Discharge status: Home / Self Care | Payer: Medicare Other | Source: Ambulatory Visit | Attending: Internal Medicine | Admitting: Internal Medicine

## 2016-08-15 DIAGNOSIS — F0280 Dementia in other diseases classified elsewhere without behavioral disturbance: Secondary | ICD-10-CM | POA: Diagnosis not present

## 2016-08-15 DIAGNOSIS — E119 Type 2 diabetes mellitus without complications: Secondary | ICD-10-CM | POA: Insufficient documentation

## 2016-08-15 DIAGNOSIS — G301 Alzheimer's disease with late onset: Secondary | ICD-10-CM | POA: Diagnosis not present

## 2016-08-15 LAB — GLUCOSE, CAPILLARY: GLUCOSE-CAPILLARY: 199 mg/dL — AB (ref 65–99)

## 2016-08-16 DIAGNOSIS — E119 Type 2 diabetes mellitus without complications: Secondary | ICD-10-CM | POA: Diagnosis not present

## 2016-08-16 LAB — GLUCOSE, CAPILLARY
GLUCOSE-CAPILLARY: 126 mg/dL — AB (ref 65–99)
GLUCOSE-CAPILLARY: 116 mg/dL — AB (ref 65–99)

## 2016-08-17 DIAGNOSIS — I25810 Atherosclerosis of coronary artery bypass graft(s) without angina pectoris: Secondary | ICD-10-CM | POA: Diagnosis not present

## 2016-08-17 DIAGNOSIS — R627 Adult failure to thrive: Secondary | ICD-10-CM | POA: Diagnosis not present

## 2016-08-17 DIAGNOSIS — F0280 Dementia in other diseases classified elsewhere without behavioral disturbance: Secondary | ICD-10-CM | POA: Diagnosis not present

## 2016-08-17 DIAGNOSIS — I10 Essential (primary) hypertension: Secondary | ICD-10-CM | POA: Diagnosis not present

## 2016-08-17 DIAGNOSIS — R1310 Dysphagia, unspecified: Secondary | ICD-10-CM | POA: Diagnosis not present

## 2016-08-17 DIAGNOSIS — G309 Alzheimer's disease, unspecified: Secondary | ICD-10-CM | POA: Diagnosis not present

## 2016-08-17 DIAGNOSIS — Z515 Encounter for palliative care: Secondary | ICD-10-CM | POA: Diagnosis not present

## 2016-08-17 DIAGNOSIS — Z951 Presence of aortocoronary bypass graft: Secondary | ICD-10-CM | POA: Diagnosis not present

## 2016-08-17 DIAGNOSIS — Z66 Do not resuscitate: Secondary | ICD-10-CM | POA: Diagnosis not present

## 2016-08-17 DIAGNOSIS — E119 Type 2 diabetes mellitus without complications: Secondary | ICD-10-CM | POA: Diagnosis not present

## 2016-08-17 LAB — GLUCOSE, CAPILLARY
GLUCOSE-CAPILLARY: 109 mg/dL — AB (ref 65–99)
GLUCOSE-CAPILLARY: 118 mg/dL — AB (ref 65–99)

## 2016-08-18 DIAGNOSIS — E119 Type 2 diabetes mellitus without complications: Secondary | ICD-10-CM | POA: Diagnosis not present

## 2016-08-18 LAB — GLUCOSE, CAPILLARY
GLUCOSE-CAPILLARY: 115 mg/dL — AB (ref 65–99)
GLUCOSE-CAPILLARY: 158 mg/dL — AB (ref 65–99)

## 2016-08-24 NOTE — Progress Notes (Signed)
Location:      Place of Service:  SNF (31) Provider:  Lorenso QuarryShannon Madlyn Crosby, NP-C  Olmedo, Joycie PeekMario Ernesto, MD  Patient Care Team: Dione HousekeeperMario Ernesto Olmedo, MD as PCP - General  Extended Emergency Contact Information Primary Emergency Contact: Karim,Elizabeth Address: [address]          [place], [place] [address] [place] Home Phone: [phone] Work Phone: [phone] Mobile Phone: [phone] Relation: Daughter Secondary Emergency Contact: Thressa ShellerHUNT,KATIE S Address: [address]          [place], [place] [address] Home Phone: [phone] Relation: None  Code Status:  dnr Goals of care: Advanced Directive information No flowsheet data found.   Chief Complaint  Patient presents with  . Acute Visit    HPI:  [name] seen today for an acute visit for evaluation of skin irritation on sacral area. Pt has a h/o sacral decubitous. Pt has had significant decline r/t Alzheimer's. Pt is unable to turn and unable to feel/ verbalize when incontinent of urine.Pt has also been sitting up in the chair more causing pt to slide in the seat, leading to moisture associated skin trauma. Skin is red, excoriated. Blanchable redness. Pt non-verbal. Unable to vocalize any other complaints. VSS   Past Medical History:  Diagnosis Date  . Allergy   . Diabetes mellitus without complication   . Hearing loss   . Heart trouble   . Hypertension   . Memory loss    Past Surgical History:  Procedure Laterality Date  . PACEMAKER PLACEMENT      Allergies  Allergen Reactions  . Prednisone       Medication List       Accurate as of 08/09/16 11:59 PM. Always use your most recent med list.          carvedilol 3.125 MG tablet Commonly known as:  COREG   donepezil 5 MG tablet Commonly known as:  ARICEPT   lisinopril 40 MG tablet Commonly known as:  PRINIVIL,ZESTRIL   tamsulosin 0.4 MG Caps capsule Commonly known as:  FLOMAX   traZODone 50 MG tablet Commonly known as:  DESYREL   triamcinolone cream 0.1 % Commonly known as:  KENALOG       Review of Systems  Unable to perform ROS: Patient nonverbal  Constitutional: Negative for activity change, appetite change, chills, diaphoresis and fever.  Respiratory: Positive for choking (intermittent. Daughter continues to feed pt despite education about aspiration). Negative for shortness of breath.   Cardiovascular: Negative for chest pain.  Genitourinary: Difficulty urinating: incontinent.  Skin: Positive for wound.     There is no immunization history on file for this patient. Pertinent  Health Maintenance Due  Topic Date Due  . HEMOGLOBIN A1C  01[birth year]05/1929  . FOOT EXAM  08/02[birth year]1939  . OPHTHALMOLOGY EXAM  08/02/1939  . PNA vac Low Risk Adult (1 of 2 - PCV13) 08/01/1994  . INFLUENZA VACCINE  05/15/2016   No flowsheet data found. Functional Status Survey:    Vitals:   08/09/16 2200  BP: 116/72  Pulse: (!) 58  Resp: 18  Temp: 98.4 F (36.9 C)  SpO2: 95%   There is no height or weight on file to calculate BMI. Physical Exam  Constitutional: He appears listless. He appears cachectic. He is sleeping. He has a sickly appearance.  Cardiovascular: Normal rate and intact distal pulses.  An irregular rhythm present. Exam reveals no gallop and no distant heart sounds.   No murmur  heard. Pulmonary/Chest: Effort normal and breath sounds normal. No respiratory distress. He has no wheezes. He has no rales. He exhibits no tenderness.  Abdominal: Soft. He exhibits no distension. There is no tenderness.  Neurological: He appears listless.  Skin: Skin is warm and dry. He is not diaphoretic. No cyanosis. There is pallor. Nails show no clubbing.       Labs reviewed:  Recent Labs  12/30/15 1520 04/24/16 0725 08/13/16 1655  NA 139 140 139  K 4.6 3.7 3.6  CL 111 107 105  CO2 20* 27 26  GLUCOSE 134* 94 174*  BUN 78* 18 17  CREATININE 1.54* 0.71 0.95  CALCIUM 8.8* 8.7* 9.3  MG  --  2.0  --     Recent Labs   12/30/15 1520 04/24/16 0725 08/13/16 1655  AST 27 14* 16  ALT 43 14* 10*  ALKPHOS 71 75 77  BILITOT 1.1 0.9 1.0  PROT 6.4* 6.5 7.1  ALBUMIN 2.7* 3.1* 3.5    Recent Labs  12/15/15 0924 04/24/16 0725 08/13/16 1655  WBC 12.5* 8.5 10.9*  NEUTROABS 10.0* 5.7  --   HGB 13.7 14.9 15.9  HCT 40.2 43.4 48.6  MCV 87.9 85.5 91.8  PLT 283 314 274   Lab Results  Component Value Date   TSH 2.864 12/15/2015   No results found for: HGBA1C No results found for: CHOL, HDL, LDLCALC, LDLDIRECT, TRIG, CHOLHDL  Significant Diagnostic Results in last 30 days:  No results found.  Assessment/Plan 1. Dermatitis associated with moisture  DC Triad Cream  Use Barrier Cream to sacrum BID and prn  Turn frequently  Decrease amount of time pt sits in the chair on the irritated skin  Family/ staff Communication:   Total Time:  Documentation:  Face to Face:  Family/Phone:   Labs/tests ordered:    Medication list reviewed and assessed for continued appropriateness.  Brynda RimShannon H. Thamara Leger, NP-C Geriatrics [facility] [phone] [address] [place], [place] [address] Cell Phone (Mon-Fri 8am-5pm):  [phone] On Call:  [phone] & follow prompts after 5pm & weekends Office Phone:  [phone] Office Fax:  [phone]

## 2016-08-27 NOTE — Progress Notes (Signed)
Location:      Place of Service:  SNF (31) Provider:  Toni Arthurs, NP-C  Olmedo, Guy Begin, MD  Patient Care Team: Valera Castle, MD as PCP - General  Extended Emergency Contact Information Primary Emergency Contact: Korenek,Elizabeth Address: [place], [place] [address] [place] Phone: [phone] Work Phone: [phone] Mobile Phone: [phone] Relation: Daughter Secondary Emergency Contact: Carlisle Beers Address: [place], [place] [address] Home Phone: [phone] Relation: None  Code Status:  DNR Goals of care: Advanced Directive information No flowsheet data found.   Chief Complaint  Patient presents with  . Acute Visit    HPI:  [name] seen today for acute visit for altered mental status.  Sean Kaufman continues to have a very slow decline. New recent episodes of choking, cough and congestion. Decreased level of consciousness. Decreased responsiveness to stimuli. He's still on a very intermittent basis will sit at the desk and sing, but very less frequently than he used to. He now mainly stays in bed with his eyes closed in a fetal-like position.  Daughter continues to come in and feed him lunch and sometimes supper on a daily basis. She has been educated on multiple occasions by multiple staff members to stop feeding Sean Buntrock if he begins to cough, choke, or fall asleep while eating. She verbalizes understanding of the risks of aspiration, but continues to feed at times. Pt has been having increased frequency of episodes of coughing with feeding. Daughter then insists on staff giving the pt a breathing treatment. Vital signs stable. No complaints. Past pressure wound on sacrum is healed, but now with moisture associated skin irritation.     Past Medical History:  Diagnosis Date  . Allergy   . Diabetes mellitus without complication   . Hearing loss   . Heart trouble   . Hypertension   . Memory  loss    Past Surgical History:  Procedure Laterality Date  . PACEMAKER PLACEMENT      Allergies  Allergen Reactions  . Prednisone       Medication List       Accurate as of 08/13/16 11:59 PM. Always use your most recent med list.          carvedilol 3.125 MG tablet Commonly known as:  COREG   donepezil 5 MG tablet Commonly known as:  ARICEPT   lisinopril 40 MG tablet Commonly known as:  PRINIVIL,ZESTRIL   tamsulosin 0.4 MG Caps capsule Commonly known as:  FLOMAX   traZODone 50 MG tablet Commonly known as:  DESYREL   triamcinolone cream 0.1 % Commonly known as:  KENALOG       Review of Systems  Unable to perform ROS: Dementia  Constitutional: Positive for activity change, appetite change and fatigue.  HENT: Positive for drooling. Negative for congestion.   Eyes: Negative.   Respiratory: Positive for cough, choking (more frequent lately), shortness of breath and wheezing.   Cardiovascular: Negative.  Negative for leg swelling.  Gastrointestinal: Negative.   Genitourinary: Negative.   Musculoskeletal: Negative.   Skin: Negative.   Neurological: Positive for weakness.  Psychiatric/Behavioral: Negative.      There is no immunization history on file for this patient. Pertinent  Health Maintenance Due  Topic Date Due  . HEMOGLOBIN A1C  10/16/1926  . FOOT EXAM  08/02/1939  .  OPHTHALMOLOGY EXAM  08/02/1939  . PNA vac Low Risk Adult (1 of 2 - PCV13) 08/01/1994  . INFLUENZA VACCINE  05/15/2016   No flowsheet data found. Functional Status Survey:    Vitals:   08/13/16 1200  BP: (!) 148/68  Pulse: 70  Resp: (!) 24  Temp: 98 F (36.7 C)  SpO2: 95%   There is no height or weight on file to calculate BMI. Physical Exam  Constitutional: Vital signs are normal. He appears well-developed. He appears lethargic. He appears cachectic. He is sleeping. He appears ill.  HENT:  Mouth/Throat: Oropharynx is clear and moist and mucous membranes are normal.    Eyes: Conjunctivae and lids are normal.  Neck: Trachea normal. No JVD present.  Cardiovascular: Regular rhythm and intact distal pulses.  Bradycardia present.  Exam reveals distant heart sounds. Exam reveals no gallop and no friction rub.   Murmur heard. Pulses:      Dorsalis pedis pulses are 1+ on the right side, and 1+ on the left side.  Pulmonary/Chest: Effort normal. No accessory muscle usage. No respiratory distress. He has decreased breath sounds in the left lower field. He has no wheezes. He has rhonchi (faint) in the right upper field, the right middle field, the right lower field, the left upper field, the left middle field and the left lower field. He has no rales.  Abdominal: Soft. Normal appearance. Bowel sounds are decreased. There is no tenderness.  Neurological: He appears lethargic. He is disoriented.  Skin: Skin is warm, dry and intact. No cyanosis. There is pallor. Nails show no clubbing.  Dried blood on Left great toe at nail bed. No obvious recent trauma or source.   Nursing note and vitals reviewed.   Labs reviewed:  Recent Labs  12/30/15 1520 04/24/16 0725 08/13/16 1655  NA 139 140 139  K 4.6 3.7 3.6  CL 111 107 105  CO2 20* 27 26  GLUCOSE 134* 94 174*  BUN 78* 18 17  CREATININE 1.54* 0.71 0.95  CALCIUM 8.8* 8.7* 9.3  MG  --  2.0  --     Recent Labs  12/30/15 1520 04/24/16 0725 08/13/16 1655  AST 27 14* 16  ALT 43 14* 10*  ALKPHOS 71 75 77  BILITOT 1.1 0.9 1.0  PROT 6.4* 6.5 7.1  ALBUMIN 2.7* 3.1* 3.5    Recent Labs  12/15/15 0924 04/24/16 0725 08/13/16 1655  WBC 12.5* 8.5 10.9*  NEUTROABS 10.0* 5.7  --   HGB 13.7 14.9 15.9  HCT 40.2 43.4 48.6  MCV 87.9 85.5 91.8  PLT 283 314 274   Lab Results  Component Value Date   TSH 2.864 12/15/2015   No results found for: HGBA1C No results found for: CHOL, HDL, LDLCALC, LDLDIRECT, TRIG, CHOLHDL  Significant Diagnostic Results in last 30 days:  No results found.  Assessment/Plan 1.  Altered Mental Status, unspecified   Continue getting pt up in the chair frequently  Allow for naps in bed during the day  Encourage meals in the common areas for supervision and stimulation  Monitor for safety  Check labs and cxr     Family/ staff Communication:   Total Time:  Documentation:  Face to Face:  Family/Phone: Communicate with daughter on a regular, ongoing basis due to daily presence on the unit   Labs/tests ordered:  Cbc, met c, cxr  Medication list reviewed and assessed for continued appropriateness. Monthly medication orders reviewed and signed.  Vikki Ports, NP-C Geriatrics [facility]  [facility] [address], [place] [address] Cell Phone (Mon-Fri 8am-5pm):  [phone] On Call:  [phone] & follow prompts after 5pm & weekends Office Phone:  [phone] Office Fax:  [phone]

## 2016-08-27 NOTE — Progress Notes (Signed)
Location:      Place of Service:  SNF (31) Provider:  Lorenso QuarryShannon Zylen Wenig, NP-C  Olmedo, Joycie PeekMario Ernesto, MD  Patient Care Team: Dione HousekeeperMario Ernesto Olmedo, MD as PCP - General  Extended Emergency Contact Information Primary Emergency Contact: Treinen,Elizabeth Address: [address]          [place], [place] [address] [place] Home Phone: [phone] Work Phone: [phone] Mobile Phone: [phone] Relation: Daughter Secondary Emergency Contact: Thressa ShellerHUNT,KATIE S Address: [address]          [place], [place] [address] Home Phone: [phone] Relation: None  Code Status:  dnr Goals of care: Advanced Directive information No flowsheet data found.   Chief Complaint  Patient presents with  . Follow-up    HPI:  [name] seen today for medical management of chronic diseases. Sean Kaufman has had a rapid decline over the past week. He is no longer responsive to stimuli except moaning and crying with movement/ turning.Tonight, the daughter wanted a fresh set of vital signs taken. Pt is hypertensive, tachycardic, and tachypneic. I had a long conversation with the daughter regarding these findings. I explained the non-verbal signs of pain we see- such as the crying and moaning, facial grimacing, etc with turning. We also discussed the changes in vitals that display pain- the high BP, tachycardia and tachypnea. We discussed the purpose and the function of the opioid medications. Daughter expressed continued apprehension regarding the use of morphine, but was agreeable to the use of oxycodone. We discussed the fact that the medications do not hasten death, but that it makes the symptoms more tolerable, eases suffering and prevents Sean Kaufman from having to struggle to breathe and be in pain. Daughter continues to decline Hospice services. I assured her that decision was OK and we would do everything we could to keep him comfortable. In regard to being agreeable to Oxyfast, I assured her several times  that she was doing the right thing. Daughter thanked me for the conversation. I updated nursing of the decisions made.      Past Medical History:  Diagnosis Date  . Allergy   . Diabetes mellitus without complication   . Hearing loss   . Heart trouble   . Hypertension   . Memory loss    Past Surgical History:  Procedure Laterality Date  . PACEMAKER PLACEMENT      Allergies  Allergen Reactions  . Prednisone       Medication List       Accurate as of 08/15/16 11:59 PM. Always use your most recent med list.          carvedilol 3.125 MG tablet Commonly known as:  COREG   donepezil 5 MG tablet Commonly known as:  ARICEPT   lisinopril 40 MG tablet Commonly known as:  PRINIVIL,ZESTRIL   tamsulosin 0.4 MG Caps capsule Commonly known as:  FLOMAX   traZODone 50 MG tablet Commonly known as:  DESYREL   triamcinolone cream 0.1 % Commonly known as:  KENALOG       Review of Systems  Unable to perform ROS: Dementia  Constitutional: Positive for activity change, appetite change and fatigue.  HENT: Positive for drooling. Negative for congestion.   Eyes: Negative.   Respiratory: Positive for cough, choking (more frequent lately), shortness of breath and wheezing.   Cardiovascular: Negative.  Negative for leg swelling.  Gastrointestinal: Negative.   Genitourinary: Negative.   Musculoskeletal: Negative.   Skin: Negative.  Neurological: Positive for weakness.  Psychiatric/Behavioral: Negative.      There is no immunization history on file for this patient. Pertinent  Health Maintenance Due  Topic Date Due  . HEMOGLOBIN A1C  09[birth year]10[birth year]  . FOOT EXAM  08/02/1939  . OPHTHALMOLOGY EXAM  08/02/1939  . PNA vac Low Risk Adult (1 of 2 - PCV13) 08/01/1994  . INFLUENZA VACCINE  05/15/2016   No flowsheet data found. Functional Status Survey:    Vitals:   08/15/16 2040  BP: (!) 215/141  Pulse: 86  Resp: (!) 26  Temp: 98.5 F (36.9 C)  SpO2: 96%   There is no  height or weight on file to calculate BMI. Physical Exam  Constitutional: Vital signs are normal. He appears well-developed. He appears lethargic. He appears cachectic. He is sleeping. He appears ill.  HENT:  Mouth/Throat: Oropharynx is clear and moist and mucous membranes are normal.  Eyes: Conjunctivae and lids are normal.  Neck: Trachea normal. No JVD present.  Cardiovascular: Regular rhythm and intact distal pulses.  Bradycardia present.  Exam reveals distant heart sounds. Exam reveals no gallop and no friction rub.   Murmur heard. Pulses:      Dorsalis pedis pulses are 1+ on the right side, and 1+ on the left side.  Pulmonary/Chest: Accessory muscle usage present. Tachypnea noted. He is in respiratory distress. He has decreased breath sounds in the left lower field. He has no wheezes. He has rhonchi (faint) in the right upper field, the right middle field, the right lower field, the left upper field, the left middle field and the left lower field. He has no rales.  Abdominal: Soft. Normal appearance. Bowel sounds are decreased. There is no tenderness.  Neurological: He appears lethargic. He is disoriented.  Skin: Skin is warm, dry and intact. No cyanosis. There is pallor. Nails show no clubbing.     Nursing note and vitals reviewed.   Labs reviewed:  Recent Labs  12/30/15 1520 04/24/16 0725 08/13/16 1655  NA 139 140 139  K 4.6 3.7 3.6  CL 111 107 105  CO2 20* 27 26  GLUCOSE 134* 94 174*  BUN 78* 18 17  CREATININE 1.54* 0.71 0.95  CALCIUM 8.8* 8.7* 9.3  MG  --  2.0  --     Recent Labs  12/30/15 1520 04/24/16 0725 08/13/16 1655  AST 27 14* 16  ALT 43 14* 10*  ALKPHOS 71 75 77  BILITOT 1.1 0.9 1.0  PROT 6.4* 6.5 7.1  ALBUMIN 2.7* 3.1* 3.5    Recent Labs  12/15/15 0924 04/24/16 0725 08/13/16 1655  WBC 12.5* 8.5 10.9*  NEUTROABS 10.0* 5.7  --   HGB 13.7 14.9 15.9  HCT 40.2 43.4 48.6  MCV 87.9 85.5 91.8  PLT 283 314 274   Lab Results  Component Value Date    TSH 2.864 12/15/2015   No results found for: HGBA1C No results found for: CHOL, HDL, LDLCALC, LDLDIRECT, TRIG, CHOLHDL  Significant Diagnostic Results in last 30 days:  No results found.  Assessment/Plan 1. Late onset Alzheimer's disease without behavioral disturbance  Oxyfast 20 mg/ mL- 0.25-0.5 ml po/sl Q 1 hour prn- pain, dyspnea (30 mL bottle)  Atropine 1% opthalmic drops- 2 drops SL Q 30 minutes prn- secretions  Tylenol 650 mg PR Q 4 hours prn, fever  Oxygen 2 L [place]    Family/ staff Communication:   Total Time: 70 minutes  Documentation: 15 minutes  Face to Face: 55 minutes  Family/Phone: 55  minutes- counseling daughter   Labs/tests ordered:  none  Medication list reviewed and assessed for continued appropriateness. Monthly medication orders reviewed and signed.  Brynda RimShannon H. Khianna Blazina, NP-C Geriatrics [facility] [phone] [place], [place] [address] Cell Phone (Mon-Fri 8am-5pm):  [phone] On Call:  [phone] & follow prompts after 5pm & weekends Office Phone:  [phone] Office Fax:  [phone]

## 2016-09-14 ENCOUNTER — Encounter: Admission: RE | Admit: 2016-09-14 | Payer: Medicare Other | Source: Ambulatory Visit | Admitting: Internal Medicine

## 2016-09-14 DEATH — deceased
